# Patient Record
Sex: Female | Born: 1955 | Race: White | Hispanic: No | Marital: Single | Smoking: Never smoker
Health system: Southern US, Community
[De-identification: ages and names within clinical notes are randomized; demographics above are authoritative.]

## PROBLEM LIST (undated history)

## (undated) DIAGNOSIS — I493 Ventricular premature depolarization: Secondary | ICD-10-CM

## (undated) DIAGNOSIS — I2510 Atherosclerotic heart disease of native coronary artery without angina pectoris: Secondary | ICD-10-CM

## (undated) DIAGNOSIS — I471 Supraventricular tachycardia: Secondary | ICD-10-CM

## (undated) DIAGNOSIS — E7800 Pure hypercholesterolemia, unspecified: Secondary | ICD-10-CM

## (undated) DIAGNOSIS — I10 Essential (primary) hypertension: Secondary | ICD-10-CM

## (undated) DIAGNOSIS — R42 Dizziness and giddiness: Secondary | ICD-10-CM

## (undated) DIAGNOSIS — K635 Polyp of colon: Secondary | ICD-10-CM

## (undated) DIAGNOSIS — R911 Solitary pulmonary nodule: Secondary | ICD-10-CM

## (undated) DIAGNOSIS — I491 Atrial premature depolarization: Secondary | ICD-10-CM

## (undated) DIAGNOSIS — IMO0002 Reserved for concepts with insufficient information to code with codable children: Secondary | ICD-10-CM

## (undated) DIAGNOSIS — I4719 Other supraventricular tachycardia: Secondary | ICD-10-CM

## (undated) DIAGNOSIS — R001 Bradycardia, unspecified: Secondary | ICD-10-CM

## (undated) HISTORY — DX: Atherosclerotic heart disease of native coronary artery without angina pectoris: I25.10

## (undated) HISTORY — DX: Polyp of colon: K63.5

## (undated) HISTORY — DX: Reserved for concepts with insufficient information to code with codable children: IMO0002

## (undated) HISTORY — DX: Supraventricular tachycardia: I47.1

## (undated) HISTORY — DX: Dizziness and giddiness: R42

## (undated) HISTORY — PX: TONSILLECTOMY: SUR1361

## (undated) HISTORY — DX: Other supraventricular tachycardia: I47.19

## (undated) HISTORY — DX: Ventricular premature depolarization: I49.3

## (undated) HISTORY — DX: Bradycardia, unspecified: R00.1

## (undated) HISTORY — DX: Pure hypercholesterolemia, unspecified: E78.00

## (undated) HISTORY — DX: Atrial premature depolarization: I49.1

## (undated) HISTORY — DX: Solitary pulmonary nodule: R91.1

---

## 1998-04-12 ENCOUNTER — Inpatient Hospital Stay (HOSPITAL_COMMUNITY): Admission: EM | Admit: 1998-04-12 | Discharge: 1998-04-14 | Payer: Self-pay | Admitting: Emergency Medicine

## 2000-08-06 ENCOUNTER — Other Ambulatory Visit: Admission: RE | Admit: 2000-08-06 | Discharge: 2000-08-06 | Payer: Self-pay | Admitting: Obstetrics and Gynecology

## 2001-10-21 ENCOUNTER — Other Ambulatory Visit: Admission: RE | Admit: 2001-10-21 | Discharge: 2001-10-21 | Payer: Self-pay | Admitting: Obstetrics and Gynecology

## 2002-09-23 ENCOUNTER — Other Ambulatory Visit: Admission: RE | Admit: 2002-09-23 | Discharge: 2002-09-23 | Payer: Self-pay | Admitting: Obstetrics and Gynecology

## 2003-10-12 ENCOUNTER — Other Ambulatory Visit: Admission: RE | Admit: 2003-10-12 | Discharge: 2003-10-12 | Payer: Self-pay | Admitting: Obstetrics and Gynecology

## 2005-02-21 ENCOUNTER — Other Ambulatory Visit: Admission: RE | Admit: 2005-02-21 | Discharge: 2005-02-21 | Payer: Self-pay | Admitting: *Deleted

## 2005-08-20 ENCOUNTER — Encounter: Admission: RE | Admit: 2005-08-20 | Discharge: 2005-08-20 | Payer: Self-pay | Admitting: Family Medicine

## 2006-12-11 ENCOUNTER — Encounter: Admission: RE | Admit: 2006-12-11 | Discharge: 2006-12-11 | Payer: Self-pay | Admitting: Family Medicine

## 2007-03-25 ENCOUNTER — Other Ambulatory Visit: Admission: RE | Admit: 2007-03-25 | Discharge: 2007-03-25 | Payer: Self-pay | Admitting: Family Medicine

## 2008-06-22 ENCOUNTER — Other Ambulatory Visit: Admission: RE | Admit: 2008-06-22 | Discharge: 2008-06-22 | Payer: Self-pay | Admitting: Family Medicine

## 2009-06-25 ENCOUNTER — Other Ambulatory Visit: Admission: RE | Admit: 2009-06-25 | Discharge: 2009-06-25 | Payer: Self-pay | Admitting: Family Medicine

## 2012-03-23 ENCOUNTER — Encounter (HOSPITAL_COMMUNITY): Payer: Self-pay | Admitting: *Deleted

## 2012-03-23 ENCOUNTER — Emergency Department (INDEPENDENT_AMBULATORY_CARE_PROVIDER_SITE_OTHER): Payer: Self-pay

## 2012-03-23 ENCOUNTER — Emergency Department (INDEPENDENT_AMBULATORY_CARE_PROVIDER_SITE_OTHER)
Admission: EM | Admit: 2012-03-23 | Discharge: 2012-03-23 | Disposition: A | Discharge status: Home / Self Care | Payer: Self-pay | Source: Home / Self Care | Attending: Emergency Medicine | Admitting: Emergency Medicine

## 2012-03-23 DIAGNOSIS — S8010XA Contusion of unspecified lower leg, initial encounter: Secondary | ICD-10-CM

## 2012-03-23 DIAGNOSIS — S7000XA Contusion of unspecified hip, initial encounter: Secondary | ICD-10-CM

## 2012-03-23 DIAGNOSIS — S7002XA Contusion of left hip, initial encounter: Secondary | ICD-10-CM

## 2012-03-23 DIAGNOSIS — S8012XA Contusion of left lower leg, initial encounter: Secondary | ICD-10-CM

## 2012-03-23 HISTORY — DX: Essential (primary) hypertension: I10

## 2012-03-23 MED ORDER — TRAMADOL HCL 50 MG PO TABS: ORAL_TABLET | ORAL | Status: AC

## 2012-03-23 MED ORDER — HYDROCODONE-ACETAMINOPHEN 5-325 MG PO TABS: 2.0000 | ORAL_TABLET | Freq: Once | ORAL | Status: DC

## 2012-03-23 MED ORDER — HYDROCODONE-ACETAMINOPHEN 5-325 MG PO TABS
ORAL_TABLET | ORAL | Status: AC
  Filled 2012-03-23: qty 2

## 2012-03-23 MED ORDER — MELOXICAM 15 MG PO TABS: 15.0000 mg | ORAL_TABLET | Freq: Every day | ORAL | Status: AC

## 2012-03-23 NOTE — Discharge Instructions (Signed)
Cryotherapy Cryotherapy means treatment with cold. Ice or gel packs can be used to reduce both pain and swelling. Ice is the most helpful within the first 24 to 48 hours after an injury or flareup from overusing a muscle or joint. Sprains, strains, spasms, burning pain, shooting pain, and aches can all be eased with ice. Ice can also be used when recovering from surgery. Ice is effective, has very few side effects, and is safe for most people to use. PRECAUTIONS  Ice is not a safe treatment option for people with:  Raynaud's phenomenon. This is a condition affecting small blood vessels in the extremities. Exposure to cold may cause your problems to return.   Cold hypersensitivity. There are many forms of cold hypersensitivity, including:   Cold urticaria. Red, itchy hives appear on the skin when the tissues begin to warm after being iced.   Cold erythema. This is a red, itchy rash caused by exposure to cold.   Cold hemoglobinuria. Red blood cells break down when the tissues begin to warm after being iced. The hemoglobin that carry oxygen are passed into the urine because they cannot combine with blood proteins fast enough.   Numbness or altered sensitivity in the area being iced.  If you have any of the following conditions, do not use ice until you have discussed cryotherapy with your caregiver:  Heart conditions, such as arrhythmia, angina, or chronic heart disease.   High blood pressure.   Healing wounds or open skin in the area being iced.   Current infections.   Rheumatoid arthritis.   Poor circulation.   Diabetes.  Ice slows the blood flow in the region it is applied. This is beneficial when trying to stop inflamed tissues from spreading irritating chemicals to surrounding tissues. However, if you expose your skin to cold temperatures for too long or without the proper protection, you can damage your skin or nerves. Watch for signs of skin damage due to cold. HOME CARE  INSTRUCTIONS Follow these tips to use ice and cold packs safely.  Place a dry or damp towel between the ice and skin. A damp towel will cool the skin more quickly, so you may need to shorten the time that the ice is used.   For a more rapid response, add gentle compression to the ice.   Ice for no more than 10 to 20 minutes at a time. The bonier the area you are icing, the less time it will take to get the benefits of ice.   Check your skin after 5 minutes to make sure there are no signs of a poor response to cold or skin damage.   Rest 20 minutes or more in between uses.   Once your skin is numb, you can end your treatment. You can test numbness by very lightly touching your skin. The touch should be so light that you do not see the skin dimple from the pressure of your fingertip. When using ice, most people will feel these normal sensations in this order: cold, burning, aching, and numbness.   Do not use ice on someone who cannot communicate their responses to pain, such as small children or people with dementia.  HOW TO MAKE AN ICE PACK Ice packs are the most common way to use ice therapy. Other methods include ice massage, ice baths, and cryo-sprays. Muscle creams that cause a cold, tingly feeling do not offer the same benefits that ice offers and should not be used as a substitute  unless recommended by your caregiver. To make an ice pack, do one of the following:  Place crushed ice or a bag of frozen vegetables in a sealable plastic bag. Squeeze out the excess air. Place this bag inside another plastic bag. Slide the bag into a pillowcase or place a damp towel between your skin and the bag.   Mix 3 parts water with 1 part rubbing alcohol. Freeze the mixture in a sealable plastic bag. When you remove the mixture from the freezer, it will be slushy. Squeeze out the excess air. Place this bag inside another plastic bag. Slide the bag into a pillowcase or place a damp towel between your skin  and the bag.  SEEK MEDICAL CARE IF:  You develop white spots on your skin. This may give the skin a blotchy (mottled) appearance.   Your skin turns blue or pale.   Your skin becomes waxy or hard.   Your swelling gets worse.  MAKE SURE YOU:   Understand these instructions.   Will watch your condition.   Will get help right away if you are not doing well or get worse.  Document Released: 06/19/2011 Document Revised: 10/12/2011 Document Reviewed: 06/19/2011 [facility] Patient Information 2012 [place], [place].Contusion A contusion is a deep bruise. Contusions are the result of an injury that caused bleeding under the skin. The contusion may turn blue, purple, or yellow. Minor injuries will give you a painless contusion, but more severe contusions may stay painful and swollen for a few weeks.  CAUSES  A contusion is usually caused by a blow, trauma, or direct force to an area of the body. SYMPTOMS   Swelling and redness of the injured area.   Bruising of the injured area.   Tenderness and soreness of the injured area.   Pain.  DIAGNOSIS  The diagnosis can be made by taking a history and physical exam. An X-ray, CT scan, or MRI may be needed to determine if there were any associated injuries, such as fractures. TREATMENT  Specific treatment will depend on what area of the body was injured. In general, the best treatment for a contusion is resting, icing, elevating, and applying cold compresses to the injured area. Over-the-counter medicines may also be recommended for pain control. Ask your caregiver what the best treatment is for your contusion. HOME CARE INSTRUCTIONS   Put ice on the injured area.   Put ice in a plastic bag.   Place a towel between your skin and the bag.   Leave the ice on for 15 to 20 minutes, 3 to 4 times a day.   Only take over-the-counter or prescription medicines for pain, discomfort, or fever as directed by your caregiver. Your caregiver may recommend  avoiding anti-inflammatory medicines (aspirin, ibuprofen, and naproxen) for 48 hours because these medicines may increase bruising.   Rest the injured area.   If possible, elevate the injured area to reduce swelling.  SEEK IMMEDIATE MEDICAL CARE IF:   You have increased bruising or swelling.   You have pain that is getting worse.   Your swelling or pain is not relieved with medicines.  MAKE SURE YOU:   Understand these instructions.   Will watch your condition.   Will get help right away if you are not doing well or get worse.  Document Released: 08/02/2005 Document Revised: 10/12/2011 Document Reviewed: 08/28/2011 [facility] Patient Information 2012 [place], [place].

## 2012-03-23 NOTE — ED Provider Notes (Signed)
History     CSN: [id number]  Arrival date & time 03/23/12  1719   First MD Initiated Contact with Patient 03/23/12 1744      Chief Complaint  Patient presents with  . Leg Injury  . Leg Swelling  . Leg Pain    (Consider location/radiation/quality/duration/timing/severity/associated sxs/prior treatment) HPI Comments: Patient states she was struck by a metal discus on her lower lateral left thigh, and lower lateral leg at the fibula at a field and track event several hours prior to arrival. Now  reports pain, swelling, bruising. Reports pain with ambulation, but is able to walk.  Patient is a 56 y.o. female presenting with leg pain. The history is provided by the patient.  Leg Pain  The incident occurred at work. The injury mechanism was a direct blow. The pain is present in the left thigh and left leg. The quality of the pain is described as aching. The pain has been constant since onset. Pertinent negatives include no numbness, no inability to bear weight, no loss of motion, no muscle weakness and no tingling. She reports no foreign bodies present. The symptoms are aggravated by bearing weight, activity and palpation. She has tried ice and NSAIDs for the symptoms. The treatment provided moderate relief.    Past Medical History  Diagnosis Date  . High cholesterol   . Hypertension   . Irregular heart beat     History reviewed. No pertinent past surgical history.  History reviewed. No pertinent family history.  History  Substance Use Topics  . Smoking status: Never Smoker   . Smokeless tobacco: Not on file  . Alcohol Use: Yes    OB History    Grav Para Term Preterm Abortions TAB SAB Ect Mult Living                  Review of Systems  Neurological: Negative for tingling and numbness.    Allergies  Penicillins; Sulfa antibiotics; and Tetracyclines & related  Home Medications   Current Outpatient Rx  Name Route Sig Dispense Refill  . BYSTOLIC PO Oral Take 1.25 mg  by mouth 1 day or 1 dose.    [name] PRAVASTATIN SODIUM 40 MG PO TABS Oral Take 40 mg by mouth daily.    . MELOXICAM 15 MG PO TABS Oral Take 1 tablet (15 mg total) by mouth daily. 14 tablet 0  . TRAMADOL HCL 50 MG PO TABS  1-2 tabs po q 6 hr prn pain Maximum dose= 8 tablets per day 20 tablet 0    BP 146/74  Pulse 68  Temp(Src) 99 F (37.2 C) (Oral)  Resp 14  SpO2 100%  Physical Exam  Nursing note and vitals reviewed. Constitutional: She is oriented to person, place, and time. She appears well-developed and well-nourished. No distress.  HENT:  Head: Normocephalic and atraumatic.  Eyes: Conjunctivae and EOM are normal.  Neck: Normal range of motion.  Cardiovascular: Normal rate.   Pulmonary/Chest: Effort normal.  Abdominal: She exhibits no distension.  Musculoskeletal: Normal range of motion.       Legs:      14 x 7 cm hematoma lateral left thigh, 10.5 cm hematoma below knee fibula.   Neurological: She is alert and oriented to person, place, and time.  Skin: Skin is warm and dry.  Psychiatric: She has a normal mood and affect. Her behavior is normal. Judgment and thought content normal.    ED Course  Procedures (including critical care time)  Labs Reviewed -  No data to display Dg Femur Left  03/23/2012  *RADIOLOGY REPORT*  Clinical Data: Injury with pain.  LEFT FEMUR - 2 VIEW  Comparison: No comparison studies available.  Findings: Two-view exam of the left femur shows no fracture.  No worrisome lytic or sclerotic osseous abnormality.  IMPRESSION: No acute bony findings.  Original Report Authenticated By: ERIC A. MANSELL, M.D.   Dg Tibia/fibula Left  03/23/2012  *RADIOLOGY REPORT*  Clinical Data: Injury with soft tissue swelling and bruising in the region of the mid leg.  LEFT TIBIA AND FIBULA - 2 VIEW  Comparison: None.  Findings: No fracture.  No worrisome lytic or sclerotic osseous abnormality in the tibia or fibula.  IMPRESSION: No acute bony findings.  Original Report  Authenticated By: ERIC A. MANSELL, M.D.     1. Contusion, thigh and hip, left, initial encounter   2. Contusion of lower leg, left       MDM  X-ray reviewed by myself. Report per radiologist. Patient declined pain medication here. Applied Coban, pressure dressing. Home with NSAIDs, tramadol, ice, rest. F/u PRN  Luiz Blare, MD 03/24/12 1513

## 2012-03-23 NOTE — ED Notes (Signed)
Pt at track meet throwing cylinders shaped like a frisbee - pt taking photos hit left leg with cylinder large bruising and abrasion left lateral thigh just above knee and smaller bruised area left lower leg - pt has been applying ice - minimal walking onset of injury 1045am today

## 2013-06-12 ENCOUNTER — Other Ambulatory Visit: Payer: Self-pay | Admitting: Family Medicine

## 2013-06-12 ENCOUNTER — Other Ambulatory Visit (HOSPITAL_COMMUNITY)
Admission: RE | Admit: 2013-06-12 | Discharge: 2013-06-12 | Disposition: A | Discharge status: Home / Self Care | Payer: BC Managed Care – PPO | Source: Ambulatory Visit | Attending: Family Medicine | Admitting: Family Medicine

## 2013-06-12 DIAGNOSIS — Z01419 Encounter for gynecological examination (general) (routine) without abnormal findings: Secondary | ICD-10-CM | POA: Insufficient documentation

## 2013-08-25 ENCOUNTER — Encounter: Payer: Self-pay | Admitting: Cardiology

## 2013-08-25 ENCOUNTER — Encounter: Payer: Self-pay | Admitting: *Deleted

## 2013-08-26 ENCOUNTER — Encounter: Payer: Self-pay | Admitting: Cardiology

## 2013-08-26 DIAGNOSIS — I10 Essential (primary) hypertension: Secondary | ICD-10-CM | POA: Insufficient documentation

## 2013-08-26 DIAGNOSIS — I491 Atrial premature depolarization: Secondary | ICD-10-CM | POA: Insufficient documentation

## 2013-08-26 DIAGNOSIS — E785 Hyperlipidemia, unspecified: Secondary | ICD-10-CM | POA: Insufficient documentation

## 2013-08-26 DIAGNOSIS — I493 Ventricular premature depolarization: Secondary | ICD-10-CM | POA: Insufficient documentation

## 2013-08-27 ENCOUNTER — Ambulatory Visit (INDEPENDENT_AMBULATORY_CARE_PROVIDER_SITE_OTHER): Payer: BC Managed Care – PPO | Admitting: Cardiology

## 2013-08-27 ENCOUNTER — Encounter: Payer: Self-pay | Admitting: Cardiology

## 2013-08-27 VITALS — BP 122/84 | HR 64 | Ht 67.5 in | Wt 194.1 lb

## 2013-08-27 DIAGNOSIS — I491 Atrial premature depolarization: Secondary | ICD-10-CM

## 2013-08-27 DIAGNOSIS — I493 Ventricular premature depolarization: Secondary | ICD-10-CM

## 2013-08-27 DIAGNOSIS — I4949 Other premature depolarization: Secondary | ICD-10-CM

## 2013-08-27 NOTE — Progress Notes (Signed)
  [address] [place], [place]  [address] Phone: [phone] Fax:  [phone]  Date:  08/27/2013   ID:  Maria Cochran, DOB November 05, 1956, MRN [id number]  PCP:  Thora Lance, MD  Cardiologist:  Armanda Magic, MD     History of Present Illness: Maria Cochran is a 57 y.o. female with a history of PVC's.  She is doing well.  She denies any chest pain, SOB, DOE, LE edema, dizziness, palpitations or syncope.  When I saw her last she was having more palpitations and I increased her Bystolic to 2.5mg  1 tablet daily and that has pretty much resolved her palpitations.  She thinks her energy level has improved.     Wt Readings from Last 3 Encounters:  08/27/13 194 lb 1.9 oz (88.052 kg)     Past Medical History  Diagnosis Date  . Irregular heart beat   . Colonic polyp   . DDD (degenerative disc disease)   . PVC's (premature ventricular contractions)   . PAC (premature atrial contraction)   . Hypertension   . Hypercholesteremia   . Vertigo     Current Outpatient Prescriptions  Medication Sig Dispense Refill  . loratadine (CLARITIN) 10 MG tablet Take 10 mg by mouth as needed for allergies.      . Nebivolol HCl (BYSTOLIC PO) Take 2.5 mg by mouth 1 day or 1 dose.       . pravastatin (PRAVACHOL) 40 MG tablet Take 40 mg by mouth daily.      . vitamin C (ASCORBIC ACID) 500 MG tablet Take 500 mg by mouth daily.       No current facility-administered medications for this visit.    Allergies:    Allergies  Allergen Reactions  . Allegra [Fexofenadine Hcl]     [employer]   . Metoprolol     Severe fatigue   . Penicillins Swelling  . Sudafed [Pseudoephedrine Hcl]   . Sulfa Antibiotics Itching  . Tetracyclines & Related Swelling    Social History:  The patient  reports that she has never smoked. She does not have any smokeless tobacco history on file. She reports that she drinks alcohol. She reports that she does not use illicit drugs.   Family History:  [name] family  history includes CAD in her father; Heart disease in her father; Hypertension in her father; Parkinsonism in her mother.   ROS:  Please see the history of present illness.      All other systems reviewed and negative.   PHYSICAL EXAM: VS:  BP 122/84  Pulse 64  Ht 5' 7.5" (1.715 m)  Wt 194 lb 1.9 oz (88.052 kg)  BMI 29.94 kg/m2 Well nourished, well developed, in no acute distress HEENT: normal Neck: no JVD Cardiac:  normal S1, S2; RRR; no murmur Lungs:  clear to auscultation bilaterally, no wheezing, rhonchi or rales Abd: soft, nontender, no hepatomegaly Ext: no edema Skin: warm and dry Neuro:  CNs 2-12 intact, no focal abnormalities noted       ASSESSMENT AND PLAN:  1. PVC's controlled on beta blockers  - continue Bystolic    Followup with me in 6 months  Signed, Armanda Magic, MD 08/27/2013 11:43 AM

## 2013-08-27 NOTE — Patient Instructions (Signed)
Your physician recommends that you continue on your current medications as directed. Please refer to the Current Medication list given to you today.  Your physician wants you to follow-up in: 1 year with Dr. Turner. You will receive a reminder letter in the mail two months in advance. If you don't receive a letter, please call our office to schedule the follow-up appointment.  

## 2014-04-09 ENCOUNTER — Ambulatory Visit (INDEPENDENT_AMBULATORY_CARE_PROVIDER_SITE_OTHER): Payer: BC Managed Care – PPO | Admitting: Cardiology

## 2014-04-09 ENCOUNTER — Encounter: Payer: Self-pay | Admitting: Cardiology

## 2014-04-09 VITALS — BP 140/77 | HR 73 | Ht 67.5 in | Wt 188.8 lb

## 2014-04-09 DIAGNOSIS — I491 Atrial premature depolarization: Secondary | ICD-10-CM

## 2014-04-09 DIAGNOSIS — R002 Palpitations: Secondary | ICD-10-CM

## 2014-04-09 DIAGNOSIS — I493 Ventricular premature depolarization: Secondary | ICD-10-CM

## 2014-04-09 DIAGNOSIS — I4949 Other premature depolarization: Secondary | ICD-10-CM

## 2014-04-09 DIAGNOSIS — I10 Essential (primary) hypertension: Secondary | ICD-10-CM

## 2014-04-09 NOTE — Progress Notes (Addendum)
[address] [place], [place]  [address] Phone: [phone] Fax:  [phone]  Date:  04/09/2014   ID:  Maria Cochran, DOB March 03, 1956, MRN [id number]  PCP:  Thora Lance, MD  Cardiologist:  Armanda Magic, MD     History of Present Illness: Maria Cochran is a 58 y.o. female with a history of PVC's. She is doing well. She denies any chest pain, SOB, DOE, LE edema, dizziness,  or syncope. When I saw her last she was having more palpitations and I increased her Bystolic to 2.5mg  1 tablet daily and that has pretty much resolved her palpitations. She recently had a cold and the palpitations seemed to worsen.  She says her cold symptoms are better but the Palpitations have continued.    Wt Readings from Last 3 Encounters:  04/09/14 188 lb 12.8 oz (85.639 kg)  08/27/13 194 lb 1.9 oz (88.052 kg)     Past Medical History  Diagnosis Date  . Irregular heart beat   . Colonic polyp   . DDD (degenerative disc disease)   . PVC's (premature ventricular contractions)   . PAC (premature atrial contraction)   . Hypertension   . Hypercholesteremia   . Vertigo     Current Outpatient Prescriptions  Medication Sig Dispense Refill  . loratadine (CLARITIN) 10 MG tablet Take 10 mg by mouth as needed for allergies.      . Nebivolol HCl (BYSTOLIC PO) Take 2.5 mg by mouth 1 day or 1 dose.       . pravastatin (PRAVACHOL) 40 MG tablet Take 40 mg by mouth daily.       No current facility-administered medications for this visit.    Allergies:    Allergies  Allergen Reactions  . Allegra [Fexofenadine Hcl]     [employer]   . Metoprolol     Severe fatigue   . Penicillins Swelling  . Sudafed [Pseudoephedrine Hcl]   . Sulfa Antibiotics Itching  . Tetracyclines & Related Swelling    Social History:  The patient  reports that she has never smoked. She does not have any smokeless tobacco history on file. She reports that she drinks alcohol. She reports that she does not use illicit drugs.    Family History:  [name] family history includes CAD in her father; Heart disease in her father; Hypertension in her father; Parkinsonism in her mother.   ROS:  Please see the history of present illness.      All other systems reviewed and negative.   PHYSICAL EXAM: VS:  BP 140/77  Pulse 73  Ht 5' 7.5" (1.715 m)  Wt 188 lb 12.8 oz (85.639 kg)  BMI 29.12 kg/m2 Well nourished, well developed, in no acute distress HEENT: normal Neck: no JVD Cardiac:  normal S1, S2; RRR; no murmur Lungs:  clear to auscultation bilaterally, no wheezing, rhonchi or rales Abd: soft, nontender, no hepatomegaly Ext: no edema Skin: warm and dry Neuro:  CNs 2-12 intact, no focal abnormalities noted  EKG:  NSR with nonspecific ST wave abnormality    ASSESSMENT AND PLAN:  1.  PVC's controlled on beta blockers - continue Bystolic  - check BMET to make sure electrolytes are ok 2.  Palpitations - the palpitations that she has are now are slightly different in that it is more of a fluttering in her heart that lasts longer than before - event monitor to make sure these are just PVC's and not PAF 3.  HTN well controlled -  continue Bystolic  Followup with me PRN  Followup with me in 6 months     Signed, Armanda Magicraci Turner, MD 04/09/2014 1:38 PM

## 2014-04-09 NOTE — Patient Instructions (Signed)
Your physician has recommended that you wear an event monitor. Event monitors are medical devices that record the heart's electrical activity. Doctors most often us these monitors to diagnose arrhythmias. Arrhythmias are problems with the speed or rhythm of the heartbeat. The monitor is a small, portable device. You can wear one while you do your normal daily activities. This is usually used to diagnose what is causing palpitations/syncope (passing out).  Your physician recommends that you schedule a follow-up appointment as needed.  

## 2014-04-10 ENCOUNTER — Encounter: Payer: Self-pay | Admitting: Radiology

## 2014-04-10 ENCOUNTER — Other Ambulatory Visit: Payer: Self-pay | Admitting: *Deleted

## 2014-04-10 ENCOUNTER — Other Ambulatory Visit (INDEPENDENT_AMBULATORY_CARE_PROVIDER_SITE_OTHER): Payer: BC Managed Care – PPO

## 2014-04-10 ENCOUNTER — Encounter (INDEPENDENT_AMBULATORY_CARE_PROVIDER_SITE_OTHER): Payer: BC Managed Care – PPO

## 2014-04-10 DIAGNOSIS — I493 Ventricular premature depolarization: Secondary | ICD-10-CM

## 2014-04-10 DIAGNOSIS — Z79899 Other long term (current) drug therapy: Secondary | ICD-10-CM

## 2014-04-10 DIAGNOSIS — R002 Palpitations: Secondary | ICD-10-CM

## 2014-04-10 LAB — BASIC METABOLIC PANEL
BUN: 12 mg/dL (ref 6–23)
CHLORIDE: 103 meq/L (ref 96–112)
CO2: 31 meq/L (ref 19–32)
Calcium: 9.5 mg/dL (ref 8.4–10.5)
Creatinine, Ser: 1.1 mg/dL (ref 0.4–1.2)
GFR: 56.73 mL/min — ABNORMAL LOW (ref >60.00)
Glucose, Bld: 82 mg/dL (ref 70–99)
Potassium: 4.1 mEq/L (ref 3.5–5.1)
Sodium: 139 mEq/L (ref 135–145)

## 2014-04-10 NOTE — Progress Notes (Signed)
Patient ID: Maria Cochran, female   DOB: 07-04-1956, 58 y.o.   MRN: [id number] [employer] 30 day monitor applied

## 2014-05-25 ENCOUNTER — Telehealth: Payer: Self-pay | Admitting: Cardiology

## 2014-05-25 NOTE — Telephone Encounter (Signed)
Dr Mayford Knifeturner has not reviewed Report yet. Will call once Dr Mayford Knifeurner has reviewed.

## 2014-05-25 NOTE — Telephone Encounter (Signed)
New message ° ° ° °Pt is calling for monitor results. °

## 2014-05-26 ENCOUNTER — Telehealth: Payer: Self-pay | Admitting: Cardiology

## 2014-05-26 NOTE — Telephone Encounter (Signed)
lmtrc

## 2014-05-26 NOTE — Telephone Encounter (Signed)
Copied over to other telephone encounter

## 2014-05-26 NOTE — Telephone Encounter (Signed)
Please let patient know that heart monitor showed NSR with HR 46bpm (during sleep) to 11bpm with 6 PAC's in a row which are benign

## 2014-05-26 NOTE — Telephone Encounter (Signed)
[name], MD at 05/26/2014 7:51 AM     Status: Signed        Please let patient know that heart monitor showed NSR with HR 46bpm (during sleep) to 11bpm with 6 PAC's in a row which are benign

## 2014-05-28 NOTE — Telephone Encounter (Signed)
Pt is aware.  

## 2014-05-28 NOTE — Telephone Encounter (Signed)
lmtrc

## 2014-05-28 NOTE — Telephone Encounter (Signed)
Follow up ° ° ° ° °Returned Maria Cochran's call °

## 2014-06-03 ENCOUNTER — Telehealth: Payer: Self-pay | Admitting: Cardiology

## 2014-06-03 NOTE — Telephone Encounter (Signed)
New Message  Pt called reports irregular heart beats since 05/29/2014. Pt would like to speak with a nurse. Please call back to discuss.

## 2014-06-03 NOTE — Telephone Encounter (Signed)
Do you want pt to come in for EKG?

## 2014-06-03 NOTE — Telephone Encounter (Signed)
Pt set up for Thursday at 11:00 for EKG

## 2014-06-03 NOTE — Telephone Encounter (Signed)
yes

## 2014-06-03 NOTE — Telephone Encounter (Signed)
Pt agreed and will be at Nurse visit.

## 2014-06-04 ENCOUNTER — Encounter: Payer: Self-pay | Admitting: General Surgery

## 2014-06-04 ENCOUNTER — Ambulatory Visit (INDEPENDENT_AMBULATORY_CARE_PROVIDER_SITE_OTHER): Payer: BC Managed Care – PPO | Admitting: *Deleted

## 2014-06-04 VITALS — BP 130/78 | HR 58 | Wt 186.0 lb

## 2014-06-04 DIAGNOSIS — R002 Palpitations: Secondary | ICD-10-CM

## 2014-06-04 LAB — MAGNESIUM: Magnesium: 2 mg/dL (ref 1.5–2.5)

## 2014-06-04 LAB — BASIC METABOLIC PANEL
BUN: 11 mg/dL (ref 6–23)
CO2: 27 mEq/L (ref 19–32)
Calcium: 9.2 mg/dL (ref 8.4–10.5)
Chloride: 103 mEq/L (ref 96–112)
Creatinine, Ser: 0.7 mg/dL (ref 0.4–1.2)
GFR: 87.2 mL/min (ref >60.00)
Glucose, Bld: 85 mg/dL (ref 70–99)
Potassium: 4.1 mEq/L (ref 3.5–5.1)
Sodium: 138 mEq/L (ref 135–145)

## 2014-06-04 NOTE — Progress Notes (Signed)
Pt here for EKG. She reports palpitations Sunday and Monday. No shortness of breath with this. Able to do all activities. No increased caffeine. Was busy and did not eat at regular times. She called office earlier this week and appt made for EKG today. She reports palpitations have gotten better throughout the week. She is feeling well today and without palpitations. EKG done. Pt information and EKG reviewed by Dr. Mayford Knifeurner. Orders given to check BMP and Mg. Pt taken to lab.  Pt aware to continue same medications.

## 2014-09-04 ENCOUNTER — Ambulatory Visit (INDEPENDENT_AMBULATORY_CARE_PROVIDER_SITE_OTHER): Payer: BC Managed Care – PPO | Admitting: Cardiology

## 2014-09-04 ENCOUNTER — Encounter: Payer: Self-pay | Admitting: Cardiology

## 2014-09-04 VITALS — BP 122/76 | HR 66 | Ht 65.0 in | Wt 188.4 lb

## 2014-09-04 DIAGNOSIS — I493 Ventricular premature depolarization: Secondary | ICD-10-CM

## 2014-09-04 DIAGNOSIS — I10 Essential (primary) hypertension: Secondary | ICD-10-CM

## 2014-09-04 DIAGNOSIS — I491 Atrial premature depolarization: Secondary | ICD-10-CM

## 2014-09-04 NOTE — Patient Instructions (Signed)
Your physician wants you to follow-up in: 6 months with Dr. Turner. You will receive a reminder letter in the mail two months in advance. If you don't receive a letter, please call our office to schedule the follow-up appointment.  Your physician recommends that you continue on your current medications as directed. Please refer to the Current Medication list given to you today.  

## 2014-09-04 NOTE — Progress Notes (Signed)
[address] [place], [place]  [id number] Phone: [phone] Fax:  [phone]  Date:  09/04/2014   ID:  Maria FailLynda G Cosma, DOB June 30, 1956, MRN [id number]  PCP:  Thora LanceEHINGER,ROBERT R, MD  Cardiologist:  Armanda Magicraci Bryanne Riquelme, MD    History of Present Illness: Maria Cochran is a 58 y.o. female with a history of PVC's. She is doing well. She denies any chest pain, SOB, DOE, LE edema, dizziness, or syncope. When I saw her last she was having more palpitations and I increased her Bystolic to 2.5mg  1 tablet daily and that pretty much resolved her palpitations. But then she had a cold and the palpitations seemed to worsen.  At her last OV she said her cold symptoms resolved but the Palpitations continued.  An event monitor was obtained which showed NSR with nonsustained atrial tachycardia up to 6 beats. She now presents back today for followup.  She had a lot of palpitations over the summer but now they have settled down.  Now she says that they occur infrequently.  She denies any SOB, dizziness, LE edema or syncope.She has noticed some discomfort in the right axilla that is worse with certain movements she does such as driving and when she sleeps on her back.  She says that it is very vague soreness.    Wt Readings from Last 3 Encounters:  09/04/14 188 lb 6.4 oz (85.458 kg)  06/04/14 186 lb (84.369 kg)  04/09/14 188 lb 12.8 oz (85.639 kg)     Past Medical History  Diagnosis Date  . Irregular heart beat   . Colonic polyp   . DDD (degenerative disc disease)   . PVC's (premature ventricular contractions)   . PAC (premature atrial contraction)   . Hypertension   . Hypercholesteremia   . Vertigo   . Atrial tachycardia, paroxysmal     Current Outpatient Prescriptions  Medication Sig Dispense Refill  . mupirocin ointment (BACTROBAN) 2 % Apply 1 application topically as needed (tick bite).       [name]. loratadine (CLARITIN) 10 MG tablet Take 10 mg by mouth as needed for allergies.      . Nebivolol HCl  (BYSTOLIC PO) Take 2.5 mg by mouth 1 day or 1 dose.       . pravastatin (PRAVACHOL) 40 MG tablet Take 40 mg by mouth daily.       No current facility-administered medications for this visit.    Allergies:    Allergies  Allergen Reactions  . Allegra [Fexofenadine Hcl]     [employer]   . Metoprolol     Severe fatigue   . Penicillins Swelling  . Sudafed [Pseudoephedrine Hcl]   . Sulfa Antibiotics Itching  . Tetracyclines & Related Swelling    Social History:  The patient  reports that she has never smoked. She does not have any smokeless tobacco history on file. She reports that she drinks alcohol. She reports that she does not use illicit drugs.   Family History:  [name] family history includes CAD in her father; Heart disease in her father; Hypertension in her father; Parkinsonism in her mother.   ROS:  Please see the history of present illness.      All other systems reviewed and negative.   PHYSICAL EXAM: VS:  BP 122/76  Pulse 66  Ht 5\' 5"  (1.651 m)  Wt 188 lb 6.4 oz (85.458 kg)  BMI 31.35 kg/m2 Well nourished, well developed, in no acute distress HEENT: normal Neck: no  JVD Cardiac:  normal S1, S2; RRR; no murmur Lungs:  clear to auscultation bilaterally, no wheezing, rhonchi or rales Abd: soft, nontender, no hepatomegaly Ext: no edema Skin: warm and dry Neuro:  CNs 2-12 intact, no focal abnormalities noted  EKG:  NSR with no ST changes with nonspecific ST abnormality     ASSESSMENT AND PLAN:  1. PVC's controlled on beta blockers  - continue Bystolic  2. Palpitations with event monitor showing no PAF and only 1 short run of nonsustained atrial tach - these seem to have settled down 3. HTN well controlled  - continue Bystolic  4.  Atypical right axillary pain that is most consistent with a muscular origin  Followup with me in 6 months   Signed, Armanda Magicraci Elzia Hott, MD [facility] 09/04/2014 3:35 PM

## 2014-11-06 DIAGNOSIS — I2510 Atherosclerotic heart disease of native coronary artery without angina pectoris: Secondary | ICD-10-CM

## 2014-11-06 HISTORY — DX: Atherosclerotic heart disease of native coronary artery without angina pectoris: I25.10

## 2014-12-09 ENCOUNTER — Encounter: Payer: Self-pay | Admitting: Cardiology

## 2015-03-18 ENCOUNTER — Ambulatory Visit: Payer: Self-pay | Admitting: Cardiology

## 2015-04-06 DIAGNOSIS — I471 Supraventricular tachycardia: Secondary | ICD-10-CM | POA: Insufficient documentation

## 2015-04-06 NOTE — Progress Notes (Signed)
Cardiology Office Note   Date:  04/07/2015   ID:  Maria Cochran, DOB 02-07-1956, MRN [id number]  PCP:  Thora LanceEHINGER,ROBERT R, MD    Chief Complaint  Patient presents with  . Follow-up    essential hypertension      History of Present Illness: Maria Cochran is a 59 y.o. female with a history of PVC's and nonsustained atrial tachycardia. She is doing well. She denies any chest pain, SOB, DOE, LE edema, dizziness, or syncope.  She now presents back today for followup.  She rarely will notice a skipped a beat.    Past Medical History  Diagnosis Date  . Irregular heart beat   . Colonic polyp   . DDD (degenerative disc disease)   . PVC's (premature ventricular contractions)   . PAC (premature atrial contraction)   . Hypertension   . Hypercholesteremia   . Vertigo   . Atrial tachycardia, paroxysmal     Past Surgical History  Procedure Laterality Date  . Tonsillectomy       Current Outpatient Prescriptions  Medication Sig Dispense Refill  . loratadine (CLARITIN) 10 MG tablet Take 10 mg by mouth as needed for allergies.    . Nebivolol HCl (BYSTOLIC PO) Take 2.5 mg by mouth daily.     . pravastatin (PRAVACHOL) 40 MG tablet Take 40 mg by mouth daily.     No current facility-administered medications for this visit.    Allergies:   Allegra; Metoprolol; Penicillins; Sudafed; Sulfa antibiotics; and Tetracyclines & related    Social History:  The patient  reports that she has never smoked. She does not have any smokeless tobacco history on file. She reports that she drinks alcohol. She reports that she does not use illicit drugs.   Family History:  [name] family history includes CAD in her father; Heart disease in her father; Hypertension in her father; Parkinsonism in her mother.    ROS:  Please see the history of present illness.   Otherwise, review of systems are positive for none.   All other systems are reviewed and negative.    PHYSICAL EXAM: VS:   BP 114/68 mmHg  Pulse 72  Ht 5\' 5"  (1.651 m)  Wt 185 lb 12.8 oz (84.278 kg)  BMI 30.92 kg/m2  SpO2 98% , BMI Body mass index is 30.92 kg/(m^2). GEN: Well nourished, well developed, in no acute distress HEENT: normal Neck: no JVD, carotid bruits, or masses Cardiac: RRR; no murmurs, rubs, or gallops,no edema  Respiratory:  clear to auscultation bilaterally, normal work of breathing GI: soft, nontender, nondistended, + BS MS: no deformity or atrophy Skin: warm and dry, no rash Neuro:  Strength and sensation are intact Psych: euthymic mood, full affect   EKG:  EKG is not ordered today.    Recent Labs: 06/04/2014: BUN 11; Creatinine 0.7; Magnesium 2.0; Potassium 4.1; Sodium 138    Lipid Panel No results found for: CHOL, TRIG, HDL, CHOLHDL, VLDL, LDLCALC, LDLDIRECT    Wt Readings from Last 3 Encounters:  04/07/15 185 lb 12.8 oz (84.278 kg)  09/04/14 188 lb 6.4 oz (85.458 kg)  06/04/14 186 lb (84.369 kg)    ASSESSMENT AND PLAN:  1. PVC's controlled on beta blockers  - continue Bystolic  2. Nonsustained atrial tach controlled on BB 3. HTN well controlled  - continue Bystolic  4.  Family history of CAD in her father - I  will get an exercise treadmill test to rule out ischemia.       Current medicines are reviewed at length with the patient today.  The patient does not have concerns regarding medicines.  The following changes have been made:  no change  Labs/ tests ordered today: See above Assessment and Plan No orders of the defined types were placed in this encounter.     Disposition:   FU with me in 1 year  Signed, Quintella Reichert, MD  04/07/2015 8:44 AM    [facility] [address] [place], [place], [place]  [address] Phone: [phone]; Fax: [phone]

## 2015-04-07 ENCOUNTER — Encounter: Payer: Self-pay | Admitting: Cardiology

## 2015-04-07 ENCOUNTER — Ambulatory Visit (INDEPENDENT_AMBULATORY_CARE_PROVIDER_SITE_OTHER): Payer: BLUE CROSS/BLUE SHIELD | Admitting: Cardiology

## 2015-04-07 VITALS — BP 114/68 | HR 72 | Ht 65.0 in | Wt 185.8 lb

## 2015-04-07 DIAGNOSIS — I471 Supraventricular tachycardia: Secondary | ICD-10-CM

## 2015-04-07 DIAGNOSIS — Z8249 Family history of ischemic heart disease and other diseases of the circulatory system: Secondary | ICD-10-CM

## 2015-04-07 DIAGNOSIS — I493 Ventricular premature depolarization: Secondary | ICD-10-CM

## 2015-04-07 DIAGNOSIS — I10 Essential (primary) hypertension: Secondary | ICD-10-CM | POA: Diagnosis not present

## 2015-04-07 NOTE — Patient Instructions (Signed)
Medication Instructions:  Your physician recommends that you continue on your current medications as directed. Please refer to the Current Medication list given to you today.   Labwork: None  Testing/Procedures: Your physician has requested that you have an exercise tolerance test. For further information please visit [email or web address]. Please also follow instruction sheet, as given.  Follow-Up: Your physician wants you to follow-up in: 1 year with Dr. Turner. You will receive a reminder letter in the mail two months in advance. If you don't receive a letter, please call our office to schedule the follow-up appointment.   Any Other Special Instructions Will Be Listed Below (If Applicable). 

## 2015-05-04 ENCOUNTER — Telehealth: Payer: Self-pay | Admitting: Cardiology

## 2015-05-04 DIAGNOSIS — R002 Palpitations: Secondary | ICD-10-CM

## 2015-05-04 NOTE — Telephone Encounter (Signed)
New Message        Pt calling stating she has had an irregular hearty beat since yesterday and very high anxiety. Please call back and advise.

## 2015-05-04 NOTE — Telephone Encounter (Signed)
Patient st her HR has been irregular for 2 days.  Patient has not checked her BP, but patient checked HR on the phone and it was 66 bpm. Patient has no complaints other than irregular HR and anxiety.   Per Dr. Mayford Knifeurner, patient to wear event monitor for 30 days.  Patient agrees with treatment plan and monitor ordered for scheduling.

## 2015-05-12 ENCOUNTER — Ambulatory Visit (INDEPENDENT_AMBULATORY_CARE_PROVIDER_SITE_OTHER): Payer: BLUE CROSS/BLUE SHIELD

## 2015-05-12 DIAGNOSIS — R002 Palpitations: Secondary | ICD-10-CM | POA: Diagnosis not present

## 2015-06-01 ENCOUNTER — Encounter: Payer: Self-pay | Admitting: Physician Assistant

## 2015-06-08 ENCOUNTER — Encounter: Payer: BLUE CROSS/BLUE SHIELD | Admitting: Physician Assistant

## 2015-06-15 ENCOUNTER — Telehealth: Payer: Self-pay | Admitting: Cardiology

## 2015-06-15 NOTE — Telephone Encounter (Signed)
New Message  Pt wanted to speak w/ RN about Heart monitor results; pt has stress test on 8/30. Pt wants to know what to do going forward. Please call back and discuss

## 2015-06-15 NOTE — Telephone Encounter (Signed)
Informed patient that monitor results were just put in the system at 1100 this AM, and Dr. Mayford Knife has not sent results/instructions to me yet. Patient would like Dr. Mayford Knife to review results ASAP. She is experiencing fluttering and not knowing results is causing "major anxiety."   To Dr. Mayford Knife.

## 2015-06-16 MED ORDER — NEBIVOLOL HCL 5 MG PO TABS: 5.0000 mg | ORAL_TABLET | Freq: Every day | ORAL | Status: DC

## 2015-06-16 NOTE — Telephone Encounter (Signed)
-----   Message from Quintella Reichert, MD sent at 06/15/2015  8:45 PM EDT ----- Please let patient know that heart monitor showed NSR with PVC's and PAC's and nonsustained atrial tachycardia.  Please increase Bystolic to  daily and call in a few days to let us know how she is doing with her palpitations.

## 2015-06-16 NOTE — Telephone Encounter (Signed)
Informed patient of results and verbal understanding expressed.  Instructed patient to INCREASE BYSTOLIC to 5 mg daily. Patient understands to call next week and let a triage nurse know how her palpitations are doing. Patient agrees with treatment plan.

## 2015-07-06 ENCOUNTER — Ambulatory Visit (INDEPENDENT_AMBULATORY_CARE_PROVIDER_SITE_OTHER): Payer: BLUE CROSS/BLUE SHIELD

## 2015-07-06 ENCOUNTER — Encounter: Payer: BLUE CROSS/BLUE SHIELD | Admitting: Cardiology

## 2015-07-06 ENCOUNTER — Encounter: Payer: Self-pay | Admitting: Cardiology

## 2015-07-06 DIAGNOSIS — Z8249 Family history of ischemic heart disease and other diseases of the circulatory system: Secondary | ICD-10-CM

## 2015-07-06 LAB — EXERCISE TOLERANCE TEST
CHL CUP MPHR: 162 {beats}/min
CHL CUP RESTING HR STRESS: 65 {beats}/min
Estimated workload: 10.1 METS
Exercise duration (min): 9 min
Peak HR: 155 {beats}/min
Percent HR: 95 %
RPE: 17

## 2015-07-07 ENCOUNTER — Telehealth: Payer: Self-pay | Admitting: Cardiology

## 2015-07-07 DIAGNOSIS — R9439 Abnormal result of other cardiovascular function study: Secondary | ICD-10-CM

## 2015-07-07 NOTE — Telephone Encounter (Signed)
New message ° ° ° ° °Returning Katy's call °

## 2015-07-07 NOTE — Telephone Encounter (Signed)
Informed patient of results and verbal understanding expressed.  Exercise myoview ordered for scheduling and instructions reviewed. Patient agrees with treatment plan.

## 2015-07-07 NOTE — Telephone Encounter (Signed)
-----   Message from Quintella Reichert, MD sent at 07/06/2015  9:42 PM EDT ----- Abnormal ETT - please order a stress myoview to rule out ischemia

## 2015-07-08 ENCOUNTER — Telehealth: Payer: Self-pay | Admitting: Cardiology

## 2015-07-08 NOTE — Telephone Encounter (Signed)
Patient concerned about activity level before nuclear stress test. Instructed the patient to take it easy until her stress test Tuesday and to have a nice, relaxing weekend. Patient grateful for callback.

## 2015-07-08 NOTE — Telephone Encounter (Signed)
New Message  Pt requested to speak w/ Katy. Pt would not specify. Please call back and discuss.

## 2015-07-09 ENCOUNTER — Telehealth (HOSPITAL_COMMUNITY): Payer: Self-pay

## 2015-07-09 NOTE — Telephone Encounter (Signed)
Patient given detailed instructions per Myocardial Perfusion Study Information Sheet for test on 07-13-2015 at 1215. Patient notified to arrive 15 minutes early and that it is imperative to arrive on time for appointment to keep from having the test rescheduled.  If you need to cancel or reschedule your appointment, please call the office within 24 hours of your appointment. Failure to do so may result in a cancellation of your appointment, and a $50 no show fee. Patient verbalized understanding. Randa Evens, Shepherd Finnan A

## 2015-07-13 ENCOUNTER — Ambulatory Visit (HOSPITAL_COMMUNITY): Payer: BLUE CROSS/BLUE SHIELD | Attending: Cardiology

## 2015-07-13 DIAGNOSIS — E785 Hyperlipidemia, unspecified: Secondary | ICD-10-CM | POA: Diagnosis not present

## 2015-07-13 DIAGNOSIS — R0602 Shortness of breath: Secondary | ICD-10-CM | POA: Insufficient documentation

## 2015-07-13 DIAGNOSIS — R9439 Abnormal result of other cardiovascular function study: Secondary | ICD-10-CM | POA: Insufficient documentation

## 2015-07-13 DIAGNOSIS — R42 Dizziness and giddiness: Secondary | ICD-10-CM | POA: Insufficient documentation

## 2015-07-13 DIAGNOSIS — I10 Essential (primary) hypertension: Secondary | ICD-10-CM | POA: Diagnosis not present

## 2015-07-13 LAB — MYOCARDIAL PERFUSION IMAGING
Estimated workload: 10.1 METS
Exercise duration (min): 9 min
Exercise duration (sec): 0 s
LV dias vol: 58 mL
LV sys vol: 15 mL
MPHR: 162 {beats}/min
Peak HR: 157 {beats}/min
Percent HR: 96 %
RATE: 0.27
Rest HR: 56 {beats}/min
SDS: 2
SRS: 10
SSS: 12
TID: 1.01

## 2015-07-13 MED ORDER — TECHNETIUM TC 99M SESTAMIBI GENERIC - CARDIOLITE
10.6000 | Freq: Once | INTRAVENOUS | Status: AC | PRN
  Administered 2015-07-13: 11 via INTRAVENOUS

## 2015-07-13 MED ORDER — TECHNETIUM TC 99M SESTAMIBI GENERIC - CARDIOLITE
31.4000 | Freq: Once | INTRAVENOUS | Status: AC | PRN
  Administered 2015-07-13: 31 via INTRAVENOUS

## 2015-07-16 ENCOUNTER — Telehealth: Payer: Self-pay | Admitting: Cardiology

## 2015-07-16 DIAGNOSIS — R9439 Abnormal result of other cardiovascular function study: Secondary | ICD-10-CM

## 2015-07-16 NOTE — Telephone Encounter (Signed)
New problem ° ° °Pt want to know results of stress test. °

## 2015-07-16 NOTE — Telephone Encounter (Signed)
-----   Message from Quintella Reichert, MD sent at 07/14/2015  5:03 PM EDT ----- Abnormal stress test but no symptoms of chest pain.  Abnormal EKG could be false positive ETT.  Please order a coronary CTA with morphology to assess further

## 2015-07-16 NOTE — Telephone Encounter (Signed)
Informed patient of results and verbal understanding expressed.  Coronary CTA ordered for scheduling. Patient agrees with treatment plan. 

## 2015-07-22 ENCOUNTER — Telehealth: Payer: Self-pay | Admitting: Cardiology

## 2015-07-22 NOTE — Telephone Encounter (Signed)
Patient has prescription for Xanax from PCP and has taken PRN for years. Confirmed with patient that PCP has updated medication list. Informed patient she may take her medication as directed since she has high anxiety over recent testing. Instructed patient to call if she has further questions or concerns.

## 2015-07-22 NOTE — Telephone Encounter (Signed)
New message     Is it ok for pt to take xanax?

## 2015-07-30 ENCOUNTER — Encounter: Payer: Self-pay | Admitting: Cardiology

## 2015-08-03 ENCOUNTER — Telehealth: Payer: Self-pay | Admitting: Cardiology

## 2015-08-03 NOTE — Telephone Encounter (Signed)
Notified pt of answers to her questions. She may take her medications, the test will be approximately 45 minutes and she asked the location - 1st floor, [facility] - Radiology across from medical records department.

## 2015-08-03 NOTE — Telephone Encounter (Signed)
New message     Pt is having a CTA of the heart tomorrow.  She want to know if she can take her medications and how long will the test be?

## 2015-08-04 ENCOUNTER — Ambulatory Visit (HOSPITAL_COMMUNITY)
Admission: RE | Admit: 2015-08-04 | Discharge: 2015-08-04 | Disposition: A | Discharge status: Home / Self Care | Payer: BLUE CROSS/BLUE SHIELD | Source: Ambulatory Visit | Attending: Cardiology | Admitting: Cardiology

## 2015-08-04 DIAGNOSIS — R9439 Abnormal result of other cardiovascular function study: Secondary | ICD-10-CM | POA: Diagnosis present

## 2015-08-04 DIAGNOSIS — R931 Abnormal findings on diagnostic imaging of heart and coronary circulation: Secondary | ICD-10-CM | POA: Diagnosis not present

## 2015-08-04 DIAGNOSIS — R911 Solitary pulmonary nodule: Secondary | ICD-10-CM | POA: Insufficient documentation

## 2015-08-04 DIAGNOSIS — I2510 Atherosclerotic heart disease of native coronary artery without angina pectoris: Secondary | ICD-10-CM | POA: Insufficient documentation

## 2015-08-04 MED ORDER — NITROGLYCERIN 0.4 MG SL SUBL
SUBLINGUAL_TABLET | SUBLINGUAL | Status: AC
  Administered 2015-08-04: 0.8 mg
  Filled 2015-08-04: qty 2

## 2015-08-04 MED ORDER — IOHEXOL 350 MG/ML SOLN
80.0000 mL | Freq: Once | INTRAVENOUS | Status: AC | PRN
  Administered 2015-08-04: 100 mL via INTRAVENOUS

## 2015-08-10 ENCOUNTER — Telehealth: Payer: Self-pay

## 2015-08-10 DIAGNOSIS — E7800 Pure hypercholesterolemia, unspecified: Secondary | ICD-10-CM

## 2015-08-10 DIAGNOSIS — Z01812 Encounter for preprocedural laboratory examination: Secondary | ICD-10-CM

## 2015-08-10 DIAGNOSIS — R931 Abnormal findings on diagnostic imaging of heart and coronary circulation: Secondary | ICD-10-CM

## 2015-08-10 NOTE — Telephone Encounter (Signed)
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  Please set up for left heart cath.  I tried calling her but could not reach her.  Please bring in to see PA for cath workup.

## 2015-08-10 NOTE — Telephone Encounter (Signed)
Informed patient of results and verbal understanding expressed.  OV scheduled with K. Bryant, [place], on Thursday, October 6 to work-up for L heart cath. FLP, ALT and pre-cath labs scheduled for same day. Patient agrees with treatment plan.

## 2015-08-10 NOTE — Progress Notes (Signed)
Cardiology Office Note    Date:  08/10/2015   ID:  Maria Cochran, DOB December 09, 1955, MRN [id number]  PCP:  Thora Lance, MD  Cardiologist:  Dr. Mayford Knife     History of Present Illness: Maria Cochran is a 59 y.o. female with a history of HTN, PVCs and non sustained atrial tachycardia who presents for pre cath work up after a recent high risk coronary CTA and abnormal nuclear stress test.   She was last seen by Dr Mayford Knife on 04/07/15 for follow up of palpiations that were being controlled on Bystolic. Due to a family history of CAD in her father an exercise stress test was ordered. Subsequently, a telephone note reveals that she had further palpitations. A cardiac monitor was ordered which revewaled PVCs and Non sustained atrial tach. Her Bystolic was increased to  daily. She underwent POET on 07/06/15 which returned abnormal. She was subsequently set up for ETT Nuc which also returned abnormal ( see report below). Dr. Mayford Knife felt abnormal EKG could be false positive ETT and ordered a coronary CTA with morphology to assess further. 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   She was asked to come into the office today to discuss cardiac catheterization. Patient denies any chest discomfort or SOB. She denies exertional CP or DOE. 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 She occasionally has bilateral breast tightness that she thinks is related to menopause. Sometimes she has a pinpoint pressure sensation in her chest under her breast.  Her palpitations have been well controlled on increased bystolic. No Le edema, orthopnea, PND or dizziness. No hx of CVA or bleeding. No allergies.   Studies:  - Nuclear (07/13/15):    The left ventricular ejection fraction is hyperdynamic (>65%).  Nuclear stress EF: 75%.  Horizontal ST segment depression ST segment depression was noted during stress in the V4, V5,  V6, II, III and aVF leads, and returning to baseline after 1-5 minutes of recovery.  Defect 1: There is a medium defect of moderate severity present in the mid anteroseptal, apical anterior and apical septal location.  Findings consistent with prior myocardial infarction with peri-infarct ischemia.  This is an intermediate risk study with concerning ETT as well as possible LAD perfusion d  - Cardiac CT (08/05/15):  Impressions: 1. Coronary calcium score of 519. This was 86 percentile for age and sex matched control. 2. Normal coronary origin. Right dominance. 3. There is moderate diffuse plaque in RCA and LAD with high risk features in the mid LAD plaque. An aggressive risk factor modification is recommended.   Recent Labs/Images:  No results for input(s): NA, K, BUN, CREATININE, ALT, HGB, TSH, LDLCALC, LDLDIRECT, HDL, BNP, PROBNP in the last 8760 hours.  Invalid input(s): LDL   Ct Coronary Morp W/cta Cor W/score W/ca W/cm &/or Wo/cm  08/05/2015   ADDENDUM REPORT: 08/05/2015 16:37 CLINICAL DATA:  59 year old female with abnormal stress test and no chest pain. EXAM: Cardiac/Coronary  CT TECHNIQUE: The patient was scanned on a Philips 256 scanner. FINDINGS: A 120 kV prospective scan was triggered in the descending thoracic aorta at 111 HU's. Axial non-contrast 3 mm slices were carried out through the heart. The data set was analyzed on a dedicated work station and scored using the Agatson method. Gantry rotation speed was 270 msecs and collimation was .9 mm. No beta blockade and 0.8 mg of sl NTG  was given. The 3D data set was reconstructed in 5% intervals of the 67-82 % of the R-R cycle. Diastolic phases were analyzed on a dedicated work station using MPR, MIP and VRT modes. The patient received 80 cc of contrast. Aorta:  No calcifications.  Normal caliber.  No dissection. Aortic Valve:  Trileaflet.  No calcifications. Coronary Arteries:  Normal coronary origin.  Right dominance. Left main  artery has mild calcified plaque in its distal portion associated with 25-50% stenosis that is extending into proximal LAD. LAD is a large vessel that gives rise to two diagonal branches. There is a long moderate mixed, predominantly calcified plaque in mid LAD associated with 50-69% stenosis. This plaque has a high risk feature (napkin ring sign). There is no significant plaque in distal LAD. D1 has minimal ostial plaque. D2 has no obvious plaque. LCX artery is small non-dominant and gives rise to one obtuse marginal branch. There is minimal calcified plaque in the ostial LCX artery with associated stenosis 0-25%. RCA is a large dominant vessel that gives rise to PDA and PLA. There is a moderate diffuse mixed, predominantly calcified plaque in the proximal and mid RCA with maximum stenosis 50-69%. PDA has moderate calcified plaque in its distal portion associated with 50-69%. PLA is small with no obvious stenosis. Other findings: Normal pulmonary vein drainage. No ASD or VSD found. Normal size of the atrial appendage with no filling defect. Normal size of the pulmonary artery. IMPRESSION: 1. Coronary calcium score of 519. This was 23 percentile for age and sex matched control. 2. Normal coronary origin.  Right dominance. 3. There is moderate diffuse plaque in RCA and LAD with high risk features in the mid LAD plaque. An aggressive risk factor modification is recommended. Maria Cochran Electronically Signed   By: Maria Cochran   On: 08/05/2015 16:37  08/05/2015   EXAM: OVER-READ INTERPRETATION  CT CHEST  The following report is an over-read performed by radiologist Dr. Royal Piedra [facility], Maria Cochran on 08/04/2015. This over-read does not include interpretation of cardiac or coronary anatomy or pathology. The coronary calcium score/coronary CTA interpretation by the cardiologist is attached.  COMPARISON:  None.  FINDINGS: 6 mm right lower lobe nodule (image 39 of series 204). Within the visualized  portions of the thorax there is no acute consolidative airspace disease, no pleural effusions, no pneumothorax and no lymphadenopathy. Visualized portions of the upper abdomen are unremarkable. There are no aggressive appearing lytic or blastic lesions noted in the visualized portions of the skeleton.  IMPRESSION: 1. 6 mm right lower lobe pulmonary nodule. If the patient is at high risk for bronchogenic carcinoma, follow-up chest CT at 6-12 months is recommended. If the patient is at low risk for bronchogenic carcinoma, follow-up chest CT at 12 months is recommended. This recommendation follows the consensus statement: Guidelines for Management of Small Pulmonary Nodules Detected on CT Scans: A Statement from the Fleischner Society as published in Cochran 2005;237:395-400.  Electronically Signed: By: Maria Reed M.D. On: 08/04/2015 11:37     Wt Readings from Last 3 Encounters:  07/13/15 83.915 kg (185 lb)  04/07/15 84.278 kg (185 lb 12.8 oz)  09/04/14 85.458 kg (188 lb 6.4 oz)     Past Medical History  Diagnosis Date  . Irregular heart beat   . Colonic polyp   . DDD (degenerative disc disease)   . PVC's (premature ventricular contractions)   . PAC (premature atrial contraction)   . Hypertension   . Hypercholesteremia   .  Vertigo   . Atrial tachycardia, paroxysmal     Current Outpatient Prescriptions  Medication Sig Dispense Refill  . loratadine (CLARITIN) 10 MG tablet Take 10 mg by mouth as needed for allergies.    [name] nebivolol (BYSTOLIC) 5 MG tablet Take 1 tablet (5 mg total) by mouth daily. 30 tablet 11  . pravastatin (PRAVACHOL) 40 MG tablet Take 40 mg by mouth daily.     No current facility-administered medications for this visit.     Allergies:   Allegra; Metoprolol; Penicillins; Sudafed; Sulfa antibiotics; and Tetracyclines & related   Social History:  The patient  reports that she has never smoked. She does not have any smokeless tobacco history on file. She reports  that she drinks alcohol. She reports that she does not use illicit drugs.   Family History:  [name] family history includes CAD in her father; Heart disease in her father; Hypertension in her father; Parkinsonism in her mother.   ROS:  Please see the history of present illness.  All other systems reviewed and negative.    PHYSICAL EXAM: VS:  There were no vitals taken for this visit. Well nourished, well developed, in no acute distress HEENT: normal Neck: no JVD Cardiac:  normal S1, S2; RRR; no murmur Lungs:  clear to auscultation bilaterally, no wheezing, rhonchi or rales Abd: soft, nontender, no hepatomegaly Ext: no edema Skin: warm and dry Neuro:  CNs 2-12 intact, no focal abnormalities noted  EKG:  none   ASSESSMENT AND PLAN:  Maria Cochran is a 59 y.o. female with a history of HTN, PVCs and non sustained atrial tachycardia who presents for pre cath work up after a recent high risk coronary CTA and abnormal nuclear stress test.   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  -- No hx of CVA or bleeding. No allergies or previous dye allergy. -- She has been set up for cardiac cath tomorrow with Dr. Katrinka Blazing at 12:00pm. The patient understands that risks include but are not limited to stroke (1 in 1000), death (1 in 1000), kidney failure [usually temporary] (1 in 500), bleeding (1 in 200), allergic reaction [possibly serious] (1 in 200), and agrees to proceed.  -- She is very anxious about the procedure. She takes Xanax PRN at home. I said it was okay for her to take if she needs it -- She will start ASA  today.   Palpitations- quiescent on increase bystolic. Cont  dosage.  HTN- well controlled today. 122/74   Disposition:  FU will depend on cath tomorrow +/- PCI. Follow up with Dr. Mayford Knife in 2-3 months if no intervention    Signed, Maria Cochran, Maria Cochran 08/10/2015 3:40 PM    [facility] [address] [place], [place], [place]  [address] Phone: [phone]; Fax: [phone]

## 2015-08-12 ENCOUNTER — Other Ambulatory Visit (INDEPENDENT_AMBULATORY_CARE_PROVIDER_SITE_OTHER): Payer: BLUE CROSS/BLUE SHIELD | Admitting: *Deleted

## 2015-08-12 ENCOUNTER — Ambulatory Visit (INDEPENDENT_AMBULATORY_CARE_PROVIDER_SITE_OTHER): Payer: BLUE CROSS/BLUE SHIELD | Admitting: Physician Assistant

## 2015-08-12 ENCOUNTER — Encounter: Payer: Self-pay | Admitting: Physician Assistant

## 2015-08-12 VITALS — BP 122/74 | HR 63 | Ht 67.5 in | Wt 179.4 lb

## 2015-08-12 DIAGNOSIS — I493 Ventricular premature depolarization: Secondary | ICD-10-CM | POA: Diagnosis not present

## 2015-08-12 DIAGNOSIS — R9439 Abnormal result of other cardiovascular function study: Secondary | ICD-10-CM | POA: Diagnosis not present

## 2015-08-12 DIAGNOSIS — I491 Atrial premature depolarization: Secondary | ICD-10-CM

## 2015-08-12 DIAGNOSIS — E7800 Pure hypercholesterolemia, unspecified: Secondary | ICD-10-CM

## 2015-08-12 DIAGNOSIS — I2510 Atherosclerotic heart disease of native coronary artery without angina pectoris: Secondary | ICD-10-CM | POA: Diagnosis not present

## 2015-08-12 DIAGNOSIS — R931 Abnormal findings on diagnostic imaging of heart and coronary circulation: Secondary | ICD-10-CM

## 2015-08-12 DIAGNOSIS — Z01812 Encounter for preprocedural laboratory examination: Secondary | ICD-10-CM

## 2015-08-12 LAB — BASIC METABOLIC PANEL
BUN: 12 mg/dL (ref 7–25)
CHLORIDE: 104 mmol/L (ref 98–110)
CO2: 27 mmol/L (ref 20–31)
CREATININE: 0.7 mg/dL (ref 0.50–1.05)
Calcium: 9.4 mg/dL (ref 8.6–10.4)
Glucose, Bld: 89 mg/dL (ref 65–99)
Potassium: 4.2 mmol/L (ref 3.5–5.3)
Sodium: 140 mmol/L (ref 135–146)

## 2015-08-12 LAB — LIPID PANEL
CHOLESTEROL: 184 mg/dL (ref 125–200)
HDL: 68 mg/dL (ref >=46)
LDL Cholesterol: 96 mg/dL (ref <130)
Total CHOL/HDL Ratio: 2.7 Ratio (ref <=5.0)
Triglycerides: 100 mg/dL (ref <150)
VLDL: 20 mg/dL (ref <30)

## 2015-08-12 LAB — ALT: ALT: 12 U/L (ref 6–29)

## 2015-08-12 LAB — CBC
HCT: 43.4 % (ref 36.0–46.0)
Hemoglobin: 15.2 g/dL — ABNORMAL HIGH (ref 12.0–15.0)
MCH: 31.1 pg (ref 26.0–34.0)
MCHC: 35 g/dL (ref 30.0–36.0)
MCV: 88.9 fL (ref 78.0–100.0)
MPV: 10.2 fL (ref 8.6–12.4)
PLATELETS: 226 10*3/uL (ref 150–400)
RBC: 4.88 MIL/uL (ref 3.87–5.11)
RDW: 13.5 % (ref 11.5–15.5)
WBC: 5.1 10*3/uL (ref 4.0–10.5)

## 2015-08-12 NOTE — Patient Instructions (Addendum)
Medication Instructions:   Your physician recommends that you continue on your current medications as directed. Please refer to the Current Medication list given to you today.   Labwork:   BMET CBC PT /INR PTT  Testing/Procedures:   *YOU HAVE BEEN SCHEDULED FOR A LEFT HEART CATH     ON  10   /7  / 16      @  12PM  *PLEASE ARRIVE @ 10 AM    NORTH TOWER ENTRANCE TO BE DIRECTED  TO        ADMITTING  *PLEASE HAVE NOTHING TO EAST OR DRINK AFTER MIDNIGHT THE NIGHT      BEFORE YOUR PROCEDURE   *YOU MAY TAKE ALL YOUR MORNING MEDS WITH A SMALL AMOUNT OF WATER   * MAKE SURE YOU HAVE A CHANGE OF CLOTHING JUST  IN CASE YOU MAY    Follow-Up:  POST FOLLOW UP WITH TURNER 3 MONTHS    Any Other Special Instructions Will Be Listed Below (If Applicable).

## 2015-08-13 ENCOUNTER — Encounter (HOSPITAL_COMMUNITY)
Admission: RE | Disposition: A | Discharge status: Home / Self Care | Payer: Self-pay | Source: Ambulatory Visit | Attending: Interventional Cardiology

## 2015-08-13 ENCOUNTER — Ambulatory Visit (HOSPITAL_COMMUNITY)
Admission: RE | Admit: 2015-08-13 | Discharge: 2015-08-13 | Disposition: A | Discharge status: Home / Self Care | Payer: BLUE CROSS/BLUE SHIELD | Source: Ambulatory Visit | Attending: Interventional Cardiology | Admitting: Interventional Cardiology

## 2015-08-13 DIAGNOSIS — I10 Essential (primary) hypertension: Secondary | ICD-10-CM | POA: Diagnosis not present

## 2015-08-13 DIAGNOSIS — I471 Supraventricular tachycardia: Secondary | ICD-10-CM | POA: Diagnosis not present

## 2015-08-13 DIAGNOSIS — I2510 Atherosclerotic heart disease of native coronary artery without angina pectoris: Secondary | ICD-10-CM | POA: Insufficient documentation

## 2015-08-13 DIAGNOSIS — Z8249 Family history of ischemic heart disease and other diseases of the circulatory system: Secondary | ICD-10-CM | POA: Diagnosis not present

## 2015-08-13 DIAGNOSIS — E7800 Pure hypercholesterolemia, unspecified: Secondary | ICD-10-CM | POA: Diagnosis not present

## 2015-08-13 DIAGNOSIS — R931 Abnormal findings on diagnostic imaging of heart and coronary circulation: Secondary | ICD-10-CM

## 2015-08-13 DIAGNOSIS — R9439 Abnormal result of other cardiovascular function study: Secondary | ICD-10-CM | POA: Diagnosis present

## 2015-08-13 HISTORY — PX: CARDIAC CATHETERIZATION: SHX172

## 2015-08-13 LAB — PROTIME-INR
INR: 0.95 (ref <1.50)
Prothrombin Time: 12.8 seconds (ref 11.6–15.2)

## 2015-08-13 LAB — APTT: APTT: 33 s (ref 24–37)

## 2015-08-13 SURGERY — LEFT HEART CATH AND CORONARY ANGIOGRAPHY

## 2015-08-13 MED ORDER — VERAPAMIL HCL 2.5 MG/ML IV SOLN
INTRAVENOUS | Status: AC
  Filled 2015-08-13: qty 2

## 2015-08-13 MED ORDER — SODIUM CHLORIDE 0.9 % IJ SOLN: 3.0000 mL | INTRAMUSCULAR | Status: DC | PRN

## 2015-08-13 MED ORDER — SODIUM CHLORIDE 0.9 % WEIGHT BASED INFUSION: 3.0000 mL/kg/h | INTRAVENOUS | Status: DC

## 2015-08-13 MED ORDER — HEPARIN SODIUM (PORCINE) 1000 UNIT/ML IJ SOLN
INTRAMUSCULAR | Status: DC | PRN
  Administered 2015-08-13: 4000 [IU] via INTRAVENOUS

## 2015-08-13 MED ORDER — HEPARIN (PORCINE) IN NACL 2-0.9 UNIT/ML-% IJ SOLN
INTRAMUSCULAR | Status: DC | PRN
  Administered 2015-08-13: 13:00:00

## 2015-08-13 MED ORDER — MIDAZOLAM HCL 2 MG/2ML IJ SOLN
INTRAMUSCULAR | Status: AC
  Filled 2015-08-13: qty 4

## 2015-08-13 MED ORDER — ASPIRIN 81 MG PO CHEW
CHEWABLE_TABLET | ORAL | Status: AC
  Filled 2015-08-13: qty 1

## 2015-08-13 MED ORDER — VERAPAMIL HCL 2.5 MG/ML IV SOLN
INTRAVENOUS | Status: DC | PRN
  Administered 2015-08-13: 12:00:00 via INTRA_ARTERIAL

## 2015-08-13 MED ORDER — ONDANSETRON HCL 4 MG/2ML IJ SOLN: 4.0000 mg | Freq: Four times a day (QID) | INTRAMUSCULAR | Status: DC | PRN

## 2015-08-13 MED ORDER — FENTANYL CITRATE (PF) 100 MCG/2ML IJ SOLN
INTRAMUSCULAR | Status: AC
  Filled 2015-08-13: qty 4

## 2015-08-13 MED ORDER — IOHEXOL 350 MG/ML SOLN
INTRAVENOUS | Status: DC | PRN
  Administered 2015-08-13: 80 mL via INTRACARDIAC

## 2015-08-13 MED ORDER — SODIUM CHLORIDE 0.9 % IV SOLN: 250.0000 mL | INTRAVENOUS | Status: DC | PRN

## 2015-08-13 MED ORDER — ASPIRIN 81 MG PO CHEW
81.0000 mg | CHEWABLE_TABLET | ORAL | Status: AC
  Administered 2015-08-13: 81 mg via ORAL

## 2015-08-13 MED ORDER — SODIUM CHLORIDE 0.9 % IJ SOLN: 3.0000 mL | Freq: Two times a day (BID) | INTRAMUSCULAR | Status: DC

## 2015-08-13 MED ORDER — SODIUM CHLORIDE 0.9 % WEIGHT BASED INFUSION: 1.0000 mL/kg/h | INTRAVENOUS | Status: DC

## 2015-08-13 MED ORDER — SODIUM CHLORIDE 0.9 % WEIGHT BASED INFUSION
3.0000 mL/kg/h | INTRAVENOUS | Status: AC
  Administered 2015-08-13: 3 mL/kg/h via INTRAVENOUS

## 2015-08-13 MED ORDER — ACETAMINOPHEN 325 MG PO TABS: 650.0000 mg | ORAL_TABLET | ORAL | Status: DC | PRN

## 2015-08-13 MED ORDER — LIDOCAINE HCL (PF) 1 % IJ SOLN
INTRAMUSCULAR | Status: AC
  Filled 2015-08-13: qty 30

## 2015-08-13 MED ORDER — FENTANYL CITRATE (PF) 100 MCG/2ML IJ SOLN
INTRAMUSCULAR | Status: DC | PRN
  Administered 2015-08-13: 50 ug via INTRAVENOUS

## 2015-08-13 MED ORDER — HEPARIN (PORCINE) IN NACL 2-0.9 UNIT/ML-% IJ SOLN
INTRAMUSCULAR | Status: AC
  Filled 2015-08-13: qty 1000

## 2015-08-13 MED ORDER — MIDAZOLAM HCL 2 MG/2ML IJ SOLN
INTRAMUSCULAR | Status: DC | PRN
  Administered 2015-08-13 (×2): 1 mg via INTRAVENOUS

## 2015-08-13 SURGICAL SUPPLY — 9 items
CATH INFINITI 5 FR JL3.5 (CATHETERS) ×2 IMPLANT
CATH INFINITI JR4 5F (CATHETERS) ×2 IMPLANT
DEVICE RAD COMP TR BAND LRG (VASCULAR PRODUCTS) ×2 IMPLANT
GLIDESHEATH SLEND A-KIT 6F 22G (SHEATH) ×2 IMPLANT
KIT HEART LEFT (KITS) ×2 IMPLANT
PACK CARDIAC CATHETERIZATION (CUSTOM PROCEDURE TRAY) ×2 IMPLANT
TRANSDUCER W/STOPCOCK (MISCELLANEOUS) ×2 IMPLANT
TUBING CIL FLEX 10 FLL-RA (TUBING) ×2 IMPLANT
WIRE SAFE-T 1.5MM-J .035X260CM (WIRE) ×2 IMPLANT

## 2015-08-13 NOTE — Interval H&P Note (Signed)
Cath Lab Visit (complete for each Cath Lab visit)  Clinical Evaluation Leading to the Procedure:   ACS: No.  Non-ACS:    Anginal Classification: CCS III  Anti-ischemic medical therapy: Minimal Therapy (1 class of medications)  Non-Invasive Test Results: Intermediate-risk stress test findings: cardiac mortality 1-3%/year  Prior CABG: No previous CABG      History and Physical Interval Note:  08/13/2015 11:52 AM  [name]  has presented today for surgery, with the diagnosis of abnormal stress  The various methods of treatment have been discussed with the patient and family. After consideration of risks, benefits and other options for treatment, the patient has consented to  Procedure(s): Left Heart Cath and Coronary Angiography (N/A) as a surgical intervention .  [name] history has been reviewed, patient examined, no change in status, stable for surgery.  I have reviewed the patient's chart and labs.  Questions were answered to the patient's satisfaction.     Lesleigh Noe

## 2015-08-13 NOTE — H&P (View-Only) (Signed)
Cardiology Office Note    Date:  08/10/2015   ID:  MIZANI DILDAY, DOB December 09, 1955, MRN [id number]  PCP:  Thora Lance, MD  Cardiologist:  Dr. Mayford Knife     History of Present Illness: Maria Cochran is a 59 y.o. female with a history of HTN, PVCs and non sustained atrial tachycardia who presents for pre cath work up after a recent high risk coronary CTA and abnormal nuclear stress test.   She was last seen by Dr Mayford Knife on 04/07/15 for follow up of palpiations that were being controlled on Bystolic. Due to a family history of CAD in her father an exercise stress test was ordered. Subsequently, a telephone note reveals that she had further palpitations. A cardiac monitor was ordered which revewaled PVCs and Non sustained atrial tach. Her Bystolic was increased to  daily. She underwent POET on 07/06/15 which returned abnormal. She was subsequently set up for ETT Nuc which also returned abnormal ( see report below). Dr. Mayford Knife felt abnormal EKG could be false positive ETT and ordered a coronary CTA with morphology to assess further. 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   She was asked to come into the office today to discuss cardiac catheterization. Patient denies any chest discomfort or SOB. She denies exertional CP or DOE. 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 She occasionally has bilateral breast tightness that she thinks is related to menopause. Sometimes she has a pinpoint pressure sensation in her chest under her breast.  Her palpitations have been well controlled on increased bystolic. No Le edema, orthopnea, PND or dizziness. No hx of CVA or bleeding. No allergies.   Studies:  - Nuclear (07/13/15):    The left ventricular ejection fraction is hyperdynamic (>65%).  Nuclear stress EF: 75%.  Horizontal ST segment depression ST segment depression was noted during stress in the V4, V5,  V6, II, III and aVF leads, and returning to baseline after 1-5 minutes of recovery.  Defect 1: There is a medium defect of moderate severity present in the mid anteroseptal, apical anterior and apical septal location.  Findings consistent with prior myocardial infarction with peri-infarct ischemia.  This is an intermediate risk study with concerning ETT as well as possible LAD perfusion d  - Cardiac CT (08/05/15):  Impressions: 1. Coronary calcium score of 519. This was 86 percentile for age and sex matched control. 2. Normal coronary origin. Right dominance. 3. There is moderate diffuse plaque in RCA and LAD with high risk features in the mid LAD plaque. An aggressive risk factor modification is recommended.   Recent Labs/Images:  No results for input(s): NA, K, BUN, CREATININE, ALT, HGB, TSH, LDLCALC, LDLDIRECT, HDL, BNP, PROBNP in the last 8760 hours.  Invalid input(s): LDL   Ct Coronary Morp W/cta Cor W/score W/ca W/cm &/or Wo/cm  08/05/2015   ADDENDUM REPORT: 08/05/2015 16:37 CLINICAL DATA:  59 year old female with abnormal stress test and no chest pain. EXAM: Cardiac/Coronary  CT TECHNIQUE: The patient was scanned on a Philips 256 scanner. FINDINGS: A 120 kV prospective scan was triggered in the descending thoracic aorta at 111 HU's. Axial non-contrast 3 mm slices were carried out through the heart. The data set was analyzed on a dedicated work station and scored using the Agatson method. Gantry rotation speed was 270 msecs and collimation was .9 mm. No beta blockade and 0.8 mg of sl NTG  was given. The 3D data set was reconstructed in 5% intervals of the 67-82 % of the R-R cycle. Diastolic phases were analyzed on a dedicated work station using MPR, MIP and VRT modes. The patient received 80 cc of contrast. Aorta:  No calcifications.  Normal caliber.  No dissection. Aortic Valve:  Trileaflet.  No calcifications. Coronary Arteries:  Normal coronary origin.  Right dominance. Left main  artery has mild calcified plaque in its distal portion associated with 25-50% stenosis that is extending into proximal LAD. LAD is a large vessel that gives rise to two diagonal branches. There is a long moderate mixed, predominantly calcified plaque in mid LAD associated with 50-69% stenosis. This plaque has a high risk feature (napkin ring sign). There is no significant plaque in distal LAD. D1 has minimal ostial plaque. D2 has no obvious plaque. LCX artery is small non-dominant and gives rise to one obtuse marginal branch. There is minimal calcified plaque in the ostial LCX artery with associated stenosis 0-25%. RCA is a large dominant vessel that gives rise to PDA and PLA. There is a moderate diffuse mixed, predominantly calcified plaque in the proximal and mid RCA with maximum stenosis 50-69%. PDA has moderate calcified plaque in its distal portion associated with 50-69%. PLA is small with no obvious stenosis. Other findings: Normal pulmonary vein drainage. No ASD or VSD found. Normal size of the atrial appendage with no filling defect. Normal size of the pulmonary artery. IMPRESSION: 1. Coronary calcium score of 519. This was 23 percentile for age and sex matched control. 2. Normal coronary origin.  Right dominance. 3. There is moderate diffuse plaque in RCA and LAD with high risk features in the mid LAD plaque. An aggressive risk factor modification is recommended. Tobias Alexander Electronically Signed   By: Tobias Alexander   On: 08/05/2015 16:37  08/05/2015   EXAM: OVER-READ INTERPRETATION  CT CHEST  The following report is an over-read performed by radiologist Dr. Royal Piedra [facility] on 08/04/2015. This over-read does not include interpretation of cardiac or coronary anatomy or pathology. The coronary calcium score/coronary CTA interpretation by the cardiologist is attached.  COMPARISON:  None.  FINDINGS: 6 mm right lower lobe nodule (image 39 of series 204). Within the visualized  portions of the thorax there is no acute consolidative airspace disease, no pleural effusions, no pneumothorax and no lymphadenopathy. Visualized portions of the upper abdomen are unremarkable. There are no aggressive appearing lytic or blastic lesions noted in the visualized portions of the skeleton.  IMPRESSION: 1. 6 mm right lower lobe pulmonary nodule. If the patient is at high risk for bronchogenic carcinoma, follow-up chest CT at 6-12 months is recommended. If the patient is at low risk for bronchogenic carcinoma, follow-up chest CT at 12 months is recommended. This recommendation follows the consensus statement: Guidelines for Management of Small Pulmonary Nodules Detected on CT Scans: A Statement from the Fleischner Society as published in Radiology 2005;237:395-400.  Electronically Signed: By: Trudie Reed M.D. On: 08/04/2015 11:37     Wt Readings from Last 3 Encounters:  07/13/15 83.915 kg (185 lb)  04/07/15 84.278 kg (185 lb 12.8 oz)  09/04/14 85.458 kg (188 lb 6.4 oz)     Past Medical History  Diagnosis Date  . Irregular heart beat   . Colonic polyp   . DDD (degenerative disc disease)   . PVC's (premature ventricular contractions)   . PAC (premature atrial contraction)   . Hypertension   . Hypercholesteremia   .  Vertigo   . Atrial tachycardia, paroxysmal     Current Outpatient Prescriptions  Medication Sig Dispense Refill  . loratadine (CLARITIN) 10 MG tablet Take 10 mg by mouth as needed for allergies.    [name] nebivolol (BYSTOLIC) 5 MG tablet Take 1 tablet (5 mg total) by mouth daily. 30 tablet 11  . pravastatin (PRAVACHOL) 40 MG tablet Take 40 mg by mouth daily.     No current facility-administered medications for this visit.     Allergies:   Allegra; Metoprolol; Penicillins; Sudafed; Sulfa antibiotics; and Tetracyclines & related   Social History:  The patient  reports that she has never smoked. She does not have any smokeless tobacco history on file. She reports  that she drinks alcohol. She reports that she does not use illicit drugs.   Family History:  [name] family history includes CAD in her father; Heart disease in her father; Hypertension in her father; Parkinsonism in her mother.   ROS:  Please see the history of present illness.  All other systems reviewed and negative.    PHYSICAL EXAM: VS:  There were no vitals taken for this visit. Well nourished, well developed, in no acute distress HEENT: normal Neck: no JVD Cardiac:  normal S1, S2; RRR; no murmur Lungs:  clear to auscultation bilaterally, no wheezing, rhonchi or rales Abd: soft, nontender, no hepatomegaly Ext: no edema Skin: warm and dry Neuro:  CNs 2-12 intact, no focal abnormalities noted  EKG:  none   ASSESSMENT AND PLAN:  Maria Cochran is a 59 y.o. female with a history of HTN, PVCs and non sustained atrial tachycardia who presents for pre cath work up after a recent high risk coronary CTA and abnormal nuclear stress test.   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  -- No hx of CVA or bleeding. No allergies or previous dye allergy. -- She has been set up for cardiac cath tomorrow with Dr. Katrinka Blazing at 12:00pm. The patient understands that risks include but are not limited to stroke (1 in 1000), death (1 in 1000), kidney failure [usually temporary] (1 in 500), bleeding (1 in 200), allergic reaction [possibly serious] (1 in 200), and agrees to proceed.  -- She is very anxious about the procedure. She takes Xanax PRN at home. I said it was okay for her to take if she needs it -- She will start ASA  today.   Palpitations- quiescent on increase bystolic. Cont  dosage.  HTN- well controlled today. 122/74   Disposition:  FU will depend on cath tomorrow +/- PCI. Follow up with Dr. Mayford Knife in 2-3 months if no intervention    Signed, Teddy Spike, MHS 08/10/2015 3:40 PM    [facility] [address] [place], [place], [place]  [address] Phone: [phone]; Fax: [phone]

## 2015-08-13 NOTE — Discharge Instructions (Signed)
Radial Site Care °Refer to this sheet in the next few weeks. These instructions provide you with information about caring for yourself after your procedure. Your health care provider may also give you more specific instructions. Your treatment has been planned according to current medical practices, but problems sometimes occur. Call your health care provider if you have any problems or questions after your procedure. °WHAT TO EXPECT AFTER THE PROCEDURE °After your procedure, it is typical to have the following: °· Bruising at the radial site that usually fades within 1-2 weeks. °· Blood collecting in the tissue (hematoma) that may be painful to the touch. It should usually decrease in size and tenderness within 1-2 weeks. °HOME CARE INSTRUCTIONS °· Take medicines only as directed by your health care provider. °· You may shower 24-48 hours after the procedure or as directed by your health care provider. Remove the bandage (dressing) and gently wash the site with plain soap and water. Pat the area dry with a clean towel. Do not rub the site, because this may cause bleeding. °· Do not take baths, swim, or use a hot tub until your health care provider approves. °· Check your insertion site every day for redness, swelling, or drainage. °· Do not apply powder or lotion to the site. °· Do not flex or bend the affected arm for 24 hours or as directed by your health care provider. °· Do not push or pull heavy objects with the affected arm for 24 hours or as directed by your health care provider. °· Do not lift over 10 lb (4.5 kg) for 5 days after your procedure or as directed by your health care provider. °· Ask your health care provider when it is okay to: °¨ Return to work or school. °¨ Resume usual physical activities or sports. °¨ Resume sexual activity. °· Do not drive home if you are discharged the same day as the procedure. Have someone else drive you. °· You may drive 24 hours after the procedure unless otherwise  instructed by your health care provider. °· Do not operate machinery or power tools for 24 hours after the procedure. °· If your procedure was done as an outpatient procedure, which means that you went home the same day as your procedure, a responsible adult should be with you for the first 24 hours after you arrive home. °· Keep all follow-up visits as directed by your health care provider. This is important. °SEEK MEDICAL CARE IF: °· You have a fever. °· You have chills. °· You have increased bleeding from the radial site. Hold pressure on the site. °SEEK IMMEDIATE MEDICAL CARE IF: °· You have unusual pain at the radial site. °· You have redness, warmth, or swelling at the radial site. °· You have drainage (other than a small amount of blood on the dressing) from the radial site. °· The radial site is bleeding, and the bleeding does not stop after 30 minutes of holding steady pressure on the site. °· Your arm or hand becomes pale, cool, tingly, or numb. °  °This information is not intended to replace advice given to you by your health care provider. Make sure you discuss any questions you have with your health care provider. °  °Document Released: 11/25/2010 Document Revised: 11/13/2014 Document Reviewed: 05/11/2014 °Elsevier Interactive Patient Education ©2016 Elsevier Inc. ° °

## 2015-08-16 ENCOUNTER — Encounter (HOSPITAL_COMMUNITY): Payer: Self-pay | Admitting: Interventional Cardiology

## 2015-08-17 ENCOUNTER — Ambulatory Visit (INDEPENDENT_AMBULATORY_CARE_PROVIDER_SITE_OTHER): Payer: BLUE CROSS/BLUE SHIELD | Admitting: Nurse Practitioner

## 2015-08-17 ENCOUNTER — Encounter: Payer: Self-pay | Admitting: Nurse Practitioner

## 2015-08-17 VITALS — BP 122/64 | HR 59 | Ht 67.5 in | Wt 180.4 lb

## 2015-08-17 DIAGNOSIS — I10 Essential (primary) hypertension: Secondary | ICD-10-CM

## 2015-08-17 DIAGNOSIS — E7800 Pure hypercholesterolemia, unspecified: Secondary | ICD-10-CM

## 2015-08-17 DIAGNOSIS — Z9889 Other specified postprocedural states: Secondary | ICD-10-CM | POA: Diagnosis not present

## 2015-08-17 DIAGNOSIS — Z8249 Family history of ischemic heart disease and other diseases of the circulatory system: Secondary | ICD-10-CM | POA: Diagnosis not present

## 2015-08-17 MED ORDER — NITROGLYCERIN 0.4 MG SL SUBL: 0.4000 mg | SUBLINGUAL_TABLET | SUBLINGUAL | Status: DC | PRN

## 2015-08-17 NOTE — Progress Notes (Signed)
CARDIOLOGY OFFICE NOTE  Date:  08/17/2015    Maria Cochran Date of Birth: 11/20/1955 Medical Record #[id number]  PCP:  Thora Lance, MD  Cardiologist:  Mayford Knife    Chief Complaint  Patient presents with  . Post cardiac catheterization    Seen for Dr. Mayford Knife    History of Present Illness: Maria Cochran is a 59 y.o. female who presents today for a post cardiac cath visit. Seen for Dr. Mayford Knife. She has a history of HTN, PVCs and non sustained atrial tachycardia.    She was last seen by Dr Mayford Knife on 04/07/15 for follow up of palpitations that were being controlled on Bystolic. Due to a family history of CAD in her father an exercise stress test was ordered. Subsequently, a telephone note reveals that she had further palpitations. A cardiac monitor was ordered which revewaled PVCs and Non sustained atrial tach. Her Bystolic was increased to  daily. She underwent POET on 07/06/15 which returned abnormal. She was subsequently set up for ETT Nuc which also returned abnormal ( see report below). Dr. Mayford Knife felt abnormal EKG could be false positive ETT and ordered a coronary CTA with morphology to assess further. 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 She was then referred on for cardiac cath - see below.   Comes in today. Here alone. She is doing ok. No chest pain.Not short of breath. Still with some palpitations but nothing that makes her feel bad. Lots of questions about prognosis, findings, restrictions and recommendations. Some bruising of her cath site and it is sore. 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   Past Medical History  Diagnosis Date  . Irregular heart beat   . Colonic polyp   . DDD (degenerative disc disease)   . PVC's (premature ventricular contractions)   . PAC (premature atrial contraction)   . Hypertension   . Hypercholesteremia    . Vertigo   . Atrial tachycardia, paroxysmal [facility])     Past Surgical History  Procedure Laterality Date  . Tonsillectomy    . Cardiac catheterization N/A 08/13/2015    Procedure: Left Heart Cath and Coronary Angiography;  Surgeon: Lyn Records, MD;  Location: [facility] INVASIVE CV LAB;  Service: Cardiovascular;  Laterality: N/A;     Medications: Current Outpatient Prescriptions  Medication Sig Dispense Refill  . aspirin 81 MG tablet Take 81 mg by mouth daily.    [name] atorvastatin (LIPITOR) 40 MG tablet Take 40 mg by mouth daily.    [name] ALPRAZolam (XANAX) 0.5 MG tablet Take 0.125-0.25 mg by mouth at bedtime as needed for anxiety or sleep.    [name] loratadine (CLARITIN) 10 MG tablet Take 10 mg by mouth daily as needed for allergies.     [name] nebivolol (BYSTOLIC) 5 MG tablet Take 1 tablet (5 mg total) by mouth daily. 30 tablet 11  . Polyethyl Glycol-Propyl Glycol (SYSTANE OP) Place 2 drops into both eyes as needed (for dry eyes).     No current facility-administered medications for this visit.    Allergies: Allergies  Allergen Reactions  . Sudafed [Pseudoephedrine Hcl] Other (See Comments)    Heart rate goes really fast   . Metoprolol Other (See Comments)    Severe fatigue   . Morphine And Related Other (See Comments)    Nausea and severe anxiety  . Allegra [Fexofenadine Hcl]  Other (See Comments)    achey   . Penicillins Swelling  . Sulfa Antibiotics Itching  . Tetracyclines & Related Swelling    Social History: The patient  reports that she has never smoked. She does not have any smokeless tobacco history on file. She reports that she drinks alcohol. She reports that she does not use illicit drugs.   Family History: [name] family history includes CAD in her father; Heart disease in her father; Hypertension in her father; Parkinsonism in her mother.   Review of Systems: Please see the history of present illness.   Otherwise, the review of systems is positive for none.   All  other systems are reviewed and negative.   Physical Exam: VS:  BP 122/64 mmHg  Pulse 59  Ht 5' 7.5" (1.715 m)  Wt 180 lb 6.4 oz (81.829 kg)  BMI 27.82 kg/m2  SpO2 98% .  BMI Body mass index is 27.82 kg/(m^2).  Wt Readings from Last 3 Encounters:  08/17/15 180 lb 6.4 oz (81.829 kg)  08/13/15 179 lb (81.194 kg)  08/12/15 179 lb 6.4 oz (81.375 kg)    General: Pleasant. Well developed, well nourished and in no acute distress.  HEENT: Normal. Neck: Supple, no JVD, carotid bruits, or masses noted.  Cardiac: Regular rate and rhythm. No murmurs, rubs, or gallops. No edema.  Respiratory:  Lungs are clear to auscultation bilaterally with normal work of breathing.  GI: Soft and nontender.  MS: No deformity or atrophy. Gait and ROM intact. Skin: Warm and dry. Color is normal.  Neuro:  Strength and sensation are intact and no gross focal deficits noted.  Psych: Alert, appropriate and with normal affect. Right wrist with mild bruising. Site is ok.   LABORATORY DATA:  EKG:  EKG is not ordered today.   Lab Results  Component Value Date   WBC 5.1 08/12/2015   HGB 15.2* 08/12/2015   HCT 43.4 08/12/2015   PLT 226 08/12/2015   GLUCOSE 89 08/12/2015   CHOL 184 08/12/2015   TRIG 100 08/12/2015   HDL 68 08/12/2015   LDLCALC 96 08/12/2015   ALT 12 08/12/2015   NA 140 08/12/2015   K 4.2 08/12/2015   CL 104 08/12/2015   CREATININE 0.70 08/12/2015   BUN 12 08/12/2015   CO2 27 08/12/2015   INR 0.95 08/12/2015    BNP (last 3 results) No results for input(s): BNP in the last 8760 hours.  ProBNP (last 3 results) No results for input(s): PROBNP in the last 8760 hours.   Other Studies Reviewed Today: Cardiac Cath Conclusion from 08/2015    Ramus lesion, 50% stenosed.  Prox LAD to Mid LAD lesion, 50% stenosed.  Ost 1st Diag lesion, 40% stenosed.  Dist Cx lesion, 45% stenosed.  Prox RCA to Mid RCA lesion, 30% stenosed.  RPDA lesion, 75% stenosed.   Nonobstructive , 50%,  mid left anterior descending coronary disease. Luminal irregularities in the distal circumflex.  75% mid PDA stenosis. No evidence of inferior ischemia on cardio perfusion imaging. No symptoms of angina. Medical therapy indicated.  Normal left ventricular function.    RECOMMENDATIONS:  Aggressive risk factor modification   Myoview Study Highlights     The left ventricular ejection fraction is hyperdynamic (>65%).  Nuclear stress EF: 75%.  Horizontal ST segment depression ST segment depression was noted during stress in the V4, V5, V6, II, III and aVF leads, and returning to baseline after 1-5 minutes of recovery.  Defect 1: There is a  medium defect of moderate severity present in the mid anteroseptal, apical anterior and apical septal location.  Findings consistent with prior myocardial infarction with peri-infarct ischemia.  This is an intermediate risk study with concerning ETT as well as possible LAD perfusion defect.     Assessment/Plan: 1. S/P cardiac cath following abnormal GXT/myoview/Cardiac CT - to manage with aggressive CV risk factor modification which was discussed in depth with her today. No chest pain. I did send in a RX for NTG for her to have on hand. See back as planned. Will let her return to work on Sunday - letter given.   2. HLD - now on Lipitor by PCP - goal LDL is less than 70  3. Obesity - understands need for regular exercise and weight loss  4. Palpitations - not really too bothersome for her.  5. +FH for CAD   Current medicines are reviewed with the patient today.  The patient does not have concerns regarding medicines other than what has been noted above.  The following changes have been made:  See above.  Labs/ tests ordered today include:   No orders of the defined types were placed in this encounter.     Disposition:   FU with Dr. Mayford Knife in January as planned.   Patient is agreeable to this plan and will call if any problems  develop in the interim.   Signed: Rosalio Macadamia, RN, ANP-C 08/17/2015 12:11 PM  [facility] [address] [place], [place]  [address] Phone: [phone] Fax: [phone]

## 2015-08-17 NOTE — Patient Instructions (Addendum)
We will be checking the following labs today - NONE   Medication Instructions:    Continue with your current medicines.  I sent in a RX for NTG - Use your NTG under your tongue for recurrent chest pain. May take one tablet every 5 minutes. If you are still having discomfort after 3 tablets in 15 minutes, call 911.  Testing/Procedures To Be Arranged:  N/A  Follow-Up:   See Dr. Mayford Knife as planned in January    Other Special Instructions:   Walking every day - goal is up to one hour a day  Heart healthy diet  Call the [facility] office at [phone] if you have any questions, problems or concerns.

## 2015-10-18 DIAGNOSIS — M7711 Lateral epicondylitis, right elbow: Secondary | ICD-10-CM | POA: Insufficient documentation

## 2015-10-18 DIAGNOSIS — M7710 Lateral epicondylitis, unspecified elbow: Secondary | ICD-10-CM | POA: Insufficient documentation

## 2015-11-11 ENCOUNTER — Encounter: Payer: Self-pay | Admitting: Cardiology

## 2015-11-11 DIAGNOSIS — I2510 Atherosclerotic heart disease of native coronary artery without angina pectoris: Secondary | ICD-10-CM | POA: Insufficient documentation

## 2015-11-11 NOTE — Progress Notes (Signed)
Cardiology Office Note   Date:  11/12/2015   ID:  Maria FailLynda G Breon, DOB 22-Oct-1956, MRN [id number]  PCP:  Thora LanceEHINGER,ROBERT R, MD    Chief Complaint  Patient presents with  . Coronary Artery Disease  . Hypertension  . Irregular Heart Beat      History of Present Illness: Maria Cochran is a 60 y.o. female with a history of PVC's and nonsustained atrial tachycardia dyslipidemia and borderline obstructive ASCAD of the RPDA of 75% with no ischemia on nuclear stress test and nonobstructive elsewhere by cath on medical management. She is doing well. She denies any anginal chest pain, SOB, DOE, LE edema, dizziness, or syncope. She rarely will notice a skipped a beat.  She is walking daily for 45 minutes with no problems.  She has lost 6lbs since the fall.      Past Medical History  Diagnosis Date  . Irregular heart beat   . Colonic polyp   . DDD (degenerative disc disease)   . PVC's (premature ventricular contractions)   . PAC (premature atrial contraction)   . Hypertension   . Hypercholesteremia   . Vertigo   . Atrial tachycardia, paroxysmal (HCC)   . Coronary artery disease 2016    50% prox to mid LAD, 50% ramus, 40% ostial D1, 45% distal LCX and 30% prox to mid RCA and 75% RPDA with no ischemia in inferior wall on nuclear stress test - on medical management with no angina.    Past Surgical History  Procedure Laterality Date  . Tonsillectomy    . Cardiac catheterization N/A 08/13/2015    Procedure: Left Heart Cath and Coronary Angiography;  Surgeon: Lyn RecordsHenry W Smith, MD;  [facility];  Service: Cardiovascular;  Laterality: N/A;     Current Outpatient Prescriptions  Medication Sig Dispense Refill  . ALPRAZolam (XANAX) 0.5 MG tablet Take 0.125-0.25 mg by mouth at bedtime as needed for anxiety or sleep.    [name]. aspirin 81 MG tablet Take 81 mg by mouth daily.    [name]. atorvastatin (LIPITOR) 40 MG tablet Take 40 mg by mouth daily.    [name]. loratadine (CLARITIN)  10 MG tablet Take 10 mg by mouth daily as needed for allergies.     [name]. nebivolol (BYSTOLIC) 5 MG tablet Take 1 tablet (5 mg total) by mouth daily. 30 tablet 11  . nitroGLYCERIN (NITROSTAT) 0.4 MG SL tablet Place 1 tablet (0.4 mg total) under the tongue every 5 (five) minutes as needed for chest pain. 25 tablet 3  . Polyethyl Glycol-Propyl Glycol (SYSTANE OP) Place 2 drops into both eyes as needed (for dry eyes).     No current facility-administered medications for this visit.    Allergies:   Sudafed; Metoprolol; Buprenorphine hcl; Morphine and related; Allegra; Doxycycline monohydrate; Penicillins; Sulfa antibiotics; Sulfacetamide sodium; and Tetracyclines & related    Social History:  The patient  reports that she has never smoked. She does not have any smokeless tobacco history on file. She reports that she drinks alcohol. She reports that she does not use illicit drugs.   Family History:  [name] family history includes CAD in her father; Heart disease in her father; Hypertension in her father; Parkinsonism in her mother.    ROS:  Please see the history of present illness.   Otherwise, review of systems are positive for none.   All other systems are reviewed  and negative.    PHYSICAL EXAM: VS:  BP 106/64 mmHg  Pulse 61  Ht 5' 7.5" (1.715 m)  Wt 174 lb 3.2 oz (79.017 kg)  BMI 26.87 kg/m2  SpO2 98% , BMI Body mass index is 26.87 kg/(m^2). GEN: Well nourished, well developed, in no acute distress HEENT: normal Neck: no JVD, carotid bruits, or masses Cardiac: RRR; no murmurs, rubs, or gallops,no edema  Respiratory:  clear to auscultation bilaterally, normal work of breathing GI: soft, nontender, nondistended, + BS MS: no deformity or atrophy Skin: warm and dry, no rash Neuro:  Strength and sensation are intact Psych: euthymic mood, full affect   EKG:  EKG is not ordered today.    Recent Labs: 08/12/2015: ALT 12; BUN 12; Creat 0.70; Hemoglobin 15.2*; Platelets 226;  Potassium 4.2; Sodium 140    Lipid Panel    Component Value Date/Time   CHOL 184 08/12/2015 1057   TRIG 100 08/12/2015 1057   HDL 68 08/12/2015 1057   CHOLHDL 2.7 08/12/2015 1057   VLDL 20 08/12/2015 1057   LDLCALC 96 08/12/2015 1057      Wt Readings from Last 3 Encounters:  11/12/15 174 lb 3.2 oz (79.017 kg)  08/17/15 180 lb 6.4 oz (81.829 kg)  08/13/15 179 lb (81.194 kg)      ASSESSMENT AND PLAN:  1. PVC's controlled on beta blockers  - continue Bystolic but at 2.5mg  daily due to low BP.  I told her she could take an extra 1/2 tablet as needed if palpitations increase.   2. Nonsustained atrial tach controlled on BB 3. HTN well controlled and actually on the soft side.  Complains occasionally of weakness when working.   -Decrease Bystolic to 2.5mg  daily  4.ASCAD with 50% prox to mid LAD, 50% ramus, 40% ostial D1, 45% distal LCX and 30% prox to mid RCA and 75% RPDA with no ischemia in inferior wall on nuclear stress test - on medical management with no angina.  Continue ASA/statin/BB.   5.  Dyslipidemia - continue statin.  I will get a copy of the FLP and ALT from PCP   Current medicines are reviewed at length with the patient today.  The patient does not have concerns regarding medicines.  The following changes have been made:  Decrease Bystolic to 2.5mg  daily  Labs/ tests ordered today: See above Assessment and Plan No orders of the defined types were placed in this encounter.     Disposition:   FU with me in 6 months  Signed, Quintella Reichert, MD  11/12/2015 8:49 AM    [facility] [address] [place], [place], [place]  [address] Phone: [phone]; Fax: [phone]

## 2015-11-12 ENCOUNTER — Ambulatory Visit (INDEPENDENT_AMBULATORY_CARE_PROVIDER_SITE_OTHER): Payer: BLUE CROSS/BLUE SHIELD | Admitting: Cardiology

## 2015-11-12 ENCOUNTER — Encounter: Payer: Self-pay | Admitting: Cardiology

## 2015-11-12 VITALS — BP 106/64 | HR 61 | Ht 67.5 in | Wt 174.2 lb

## 2015-11-12 DIAGNOSIS — I493 Ventricular premature depolarization: Secondary | ICD-10-CM

## 2015-11-12 DIAGNOSIS — E7800 Pure hypercholesterolemia, unspecified: Secondary | ICD-10-CM

## 2015-11-12 DIAGNOSIS — I2510 Atherosclerotic heart disease of native coronary artery without angina pectoris: Secondary | ICD-10-CM | POA: Diagnosis not present

## 2015-11-12 DIAGNOSIS — I471 Supraventricular tachycardia: Secondary | ICD-10-CM | POA: Diagnosis not present

## 2015-11-12 DIAGNOSIS — I491 Atrial premature depolarization: Secondary | ICD-10-CM

## 2015-11-12 DIAGNOSIS — I10 Essential (primary) hypertension: Secondary | ICD-10-CM

## 2015-11-12 DIAGNOSIS — I2583 Coronary atherosclerosis due to lipid rich plaque: Principal | ICD-10-CM

## 2015-11-12 MED ORDER — NEBIVOLOL HCL 2.5 MG PO TABS: 2.5000 mg | ORAL_TABLET | Freq: Every day | ORAL | Status: DC

## 2015-11-12 NOTE — Patient Instructions (Signed)
Medication Instructions:  Your physician has recommended you make the following change in your medication:  1) DECREASE BYSTOLIC to 2.5 mg daily  Labwork: None  Testing/Procedures: None  Follow-Up: Your physician wants you to follow-up in: 6 months with Dr. Mayford Knifeurner. You will receive a reminder letter in the mail two months in advance. If you don't receive a letter, please call our office to schedule the follow-up appointment.   Any Other Special Instructions Will Be Listed Below (If Applicable).     If you need a refill on your cardiac medications before your next appointment, please call your pharmacy.

## 2015-11-25 ENCOUNTER — Encounter: Payer: Self-pay | Admitting: Interventional Cardiology

## 2016-02-11 ENCOUNTER — Other Ambulatory Visit: Payer: Self-pay | Admitting: *Deleted

## 2016-02-11 MED ORDER — NEBIVOLOL HCL 2.5 MG PO TABS: 2.5000 mg | ORAL_TABLET | Freq: Every day | ORAL | Status: DC

## 2016-02-14 ENCOUNTER — Other Ambulatory Visit: Payer: Self-pay | Admitting: Cardiology

## 2016-05-08 ENCOUNTER — Telehealth: Payer: Self-pay

## 2016-05-08 NOTE — Telephone Encounter (Signed)
Prior auth for UnitedHealthBystolic 2.5mg  sent to [facility] [place].

## 2016-05-11 ENCOUNTER — Encounter (INDEPENDENT_AMBULATORY_CARE_PROVIDER_SITE_OTHER): Payer: Self-pay

## 2016-05-11 ENCOUNTER — Ambulatory Visit (INDEPENDENT_AMBULATORY_CARE_PROVIDER_SITE_OTHER): Payer: BLUE CROSS/BLUE SHIELD | Admitting: Cardiology

## 2016-05-11 ENCOUNTER — Encounter: Payer: Self-pay | Admitting: Cardiology

## 2016-05-11 VITALS — BP 118/62 | HR 64 | Ht 67.5 in | Wt 161.0 lb

## 2016-05-11 DIAGNOSIS — I471 Supraventricular tachycardia: Secondary | ICD-10-CM | POA: Diagnosis not present

## 2016-05-11 DIAGNOSIS — E7800 Pure hypercholesterolemia, unspecified: Secondary | ICD-10-CM

## 2016-05-11 DIAGNOSIS — I10 Essential (primary) hypertension: Secondary | ICD-10-CM

## 2016-05-11 DIAGNOSIS — I2510 Atherosclerotic heart disease of native coronary artery without angina pectoris: Secondary | ICD-10-CM | POA: Diagnosis not present

## 2016-05-11 DIAGNOSIS — I493 Ventricular premature depolarization: Secondary | ICD-10-CM

## 2016-05-11 DIAGNOSIS — I2583 Coronary atherosclerosis due to lipid rich plaque: Principal | ICD-10-CM

## 2016-05-11 MED ORDER — NEBIVOLOL HCL 2.5 MG PO TABS
2.5000 mg | ORAL_TABLET | Freq: Every day | ORAL | Status: DC
Start: 2016-05-11 — End: 2016-09-15

## 2016-05-11 MED ORDER — ATORVASTATIN CALCIUM 40 MG PO TABS: 40.0000 mg | ORAL_TABLET | Freq: Every day | ORAL | Status: DC

## 2016-05-11 MED ORDER — NITROGLYCERIN 0.4 MG SL SUBL: 0.4000 mg | SUBLINGUAL_TABLET | SUBLINGUAL | Status: DC | PRN

## 2016-05-11 NOTE — Progress Notes (Signed)
Cardiology Office Note    Date:  05/11/2016   ID:  Maria FailLynda G Peschke, DOB Dec 31, 1955, MRN [id number]  PCP:  Thora LanceEHINGER,ROBERT R, MD  Cardiologist:  Armanda Magicraci Turner, MD   Chief Complaint  Patient presents with  . Coronary Artery Disease  . Hypertension  . Hyperlipidemia    History of Present Illness:  Maria Cochran is a 60 y.o. female with a history of PVC's and nonsustained atrial tachycardia, dyslipidemia and borderline obstructive ASCAD of the RPDA of 75% with no ischemia on nuclear stress test and nonobstructive elsewhere by cath on medical management. She is doing well. She denies any anginal chest pain, SOB, DOE, LE edema, dizziness, or syncope. Occasionally she will notice a skipped heart beat or fluttering for 5 seconds and resolves.      Past Medical History  Diagnosis Date  . Irregular heart beat   . Colonic polyp   . DDD (degenerative disc disease)   . PVC's (premature ventricular contractions)   . PAC (premature atrial contraction)   . Hypertension   . Hypercholesteremia   . Vertigo   . Atrial tachycardia, paroxysmal (HCC)   . Coronary artery disease 2016    50% prox to mid LAD, 50% ramus, 40% ostial D1, 45% distal LCX and 30% prox to mid RCA and 75% RPDA with no ischemia in inferior wall on nuclear stress test - on medical management with no angina.    Past Surgical History  Procedure Laterality Date  . Tonsillectomy    . Cardiac catheterization N/A 08/13/2015    Procedure: Left Heart Cath and Coronary Angiography;  Surgeon: Lyn RecordsHenry W Smith, MD;  [facility];  Service: Cardiovascular;  Laterality: N/A;    Current Medications: Outpatient Prescriptions Prior to Visit  Medication Sig Dispense Refill  . ALPRAZolam (XANAX) 0.5 MG tablet Take 0.125-0.25 mg by mouth at bedtime as needed for anxiety or sleep.    [name]. aspirin 81 MG tablet Take 81 mg by mouth daily.    [name]. atorvastatin (LIPITOR) 40 MG tablet Take 40 mg by mouth daily.    [name]. loratadine (CLARITIN) 10 MG  tablet Take 10 mg by mouth daily as needed for allergies.     [name]. nebivolol (BYSTOLIC) 2.5 MG tablet Take 1 tablet (2.5 mg total) by mouth daily. 30 tablet 8  . nitroGLYCERIN (NITROSTAT) 0.4 MG SL tablet Place 1 tablet (0.4 mg total) under the tongue every 5 (five) minutes as needed for chest pain. 25 tablet 3  . Polyethyl Glycol-Propyl Glycol (SYSTANE OP) Place 2 drops into both eyes as needed (for dry eyes).     No facility-administered medications prior to visit.     Allergies:   Sudafed; Metoprolol; Buprenorphine hcl; Morphine and related; Allegra; Doxycycline monohydrate; Penicillins; Sulfa antibiotics; Sulfacetamide sodium; and Tetracyclines & related   Social History   Social History  . Marital Status: Single    Spouse Name: N/A  . Number of Children: N/A  . Years of Education: N/A   Social History Main Topics  . Smoking status: Never Smoker   . Smokeless tobacco: Never Used  . Alcohol Use: 0.0 oz/week    0 Standard drinks or equivalent per week     Comment: occasional beer and wine  . Drug Use: No  . Sexual Activity: Not Asked   Other Topics Concern  . None   Social History Narrative     Family History:  [name] family history includes CAD in her father; Heart disease in  her father; Hypertension in her father; Parkinsonism in her mother.   ROS:   Please see the history of present illness.    ROS All other systems reviewed and are negative.   PHYSICAL EXAM:   VS:  BP 118/62 mmHg  Pulse 64  Ht 5' 7.5" (1.715 m)  Wt 161 lb (73.029 kg)  BMI 24.83 kg/m2   GEN: Well nourished, well developed, in no acute distress HEENT: normal Neck: no JVD, carotid bruits, or masses Cardiac: RRR; no murmurs, rubs, or gallops,no edema.  Intact distal pulses bilaterally.  Respiratory:  clear to auscultation bilaterally, normal work of breathing GI: soft, nontender, nondistended, + BS MS: no deformity or atrophy Skin: warm and dry, no rash Neuro:  Alert and Oriented x 3,  Strength and sensation are intact Psych: euthymic mood, full affect  Wt Readings from Last 3 Encounters:  05/11/16 161 lb (73.029 kg)  11/12/15 174 lb 3.2 oz (79.017 kg)  08/17/15 180 lb 6.4 oz (81.829 kg)      Studies/Labs Reviewed:   EKG:  EKG is not ordered today.   Recent Labs: 08/12/2015: ALT 12; BUN 12; Creat 0.70; Hemoglobin 15.2*; Platelets 226; Potassium 4.2; Sodium 140   Lipid Panel    Component Value Date/Time   CHOL 184 08/12/2015 1057   TRIG 100 08/12/2015 1057   HDL 68 08/12/2015 1057   CHOLHDL 2.7 08/12/2015 1057   VLDL 20 08/12/2015 1057   LDLCALC 96 08/12/2015 1057    Additional studies/ records that were reviewed today include:  none    ASSESSMENT:    1. Coronary artery disease due to lipid rich plaque   2. Atrial tachycardia, paroxysmal (HCC)   3. PVC's (premature ventricular contractions)   4. Essential hypertension   5. Hypercholesteremia      PLAN:  In order of problems listed above:  1. ASCAD with 75% RPDA and otherwise nonobstructive CAD elsewhere on medical management with no ischemia. Continue ASA/statin/BB.   2. Paroxysmal atrial tachycardia - suppressed on BB.  Continue Bystolic.  3. PVC's - suppressed on BB. 4. HTN - controlled on BB.  Continue Bystolic.   5. Hyperlipidemia - LDL goal < 70.  Continue statin.  Check FLp and ALT.     Medication Adjustments/Labs and Tests Ordered: Current medicines are reviewed at length with the patient today.  Concerns regarding medicines are outlined above.  Medication changes, Labs and Tests ordered today are listed in the Patient Instructions below.  There are no Patient Instructions on file for this visit.   Signed, Armanda Magicraci Turner, MD  05/11/2016 8:55 AM    [facility] [address], [place], [place]  [address] Phone: [phone](336) [phone]; Fax: [phone]) [phone]

## 2016-05-11 NOTE — Patient Instructions (Signed)

## 2016-05-12 ENCOUNTER — Other Ambulatory Visit (INDEPENDENT_AMBULATORY_CARE_PROVIDER_SITE_OTHER): Payer: BLUE CROSS/BLUE SHIELD | Admitting: *Deleted

## 2016-05-12 ENCOUNTER — Telehealth: Payer: Self-pay

## 2016-05-12 DIAGNOSIS — E7800 Pure hypercholesterolemia, unspecified: Secondary | ICD-10-CM

## 2016-05-12 DIAGNOSIS — I2510 Atherosclerotic heart disease of native coronary artery without angina pectoris: Secondary | ICD-10-CM | POA: Diagnosis not present

## 2016-05-12 DIAGNOSIS — I2583 Coronary atherosclerosis due to lipid rich plaque: Principal | ICD-10-CM

## 2016-05-12 LAB — LIPID PANEL
Cholesterol: 135 mg/dL (ref 125–200)
HDL: 76 mg/dL (ref >=46)
LDL Cholesterol: 48 mg/dL (ref <130)
TRIGLYCERIDES: 55 mg/dL (ref <150)
Total CHOL/HDL Ratio: 1.8 Ratio (ref <=5.0)
VLDL: 11 mg/dL (ref <30)

## 2016-05-12 LAB — HEPATIC FUNCTION PANEL
ALBUMIN: 4.3 g/dL (ref 3.6–5.1)
ALT: 15 U/L (ref 6–29)
AST: 20 U/L (ref 10–35)
Alkaline Phosphatase: 73 U/L (ref 33–130)
Bilirubin, Direct: 0.2 mg/dL (ref <=0.2)
Indirect Bilirubin: 0.5 mg/dL (ref 0.2–1.2)
TOTAL PROTEIN: 6.1 g/dL (ref 6.1–8.1)
Total Bilirubin: 0.7 mg/dL (ref 0.2–1.2)

## 2016-05-12 NOTE — Telephone Encounter (Signed)
Prior auth for Bystolic 2.5mg  submitted to [employer].

## 2016-05-17 ENCOUNTER — Telehealth: Payer: Self-pay

## 2016-05-17 NOTE — Telephone Encounter (Signed)
Prior auth for Bystolic 2.5mg  submitted to [employer].

## 2016-05-17 NOTE — Telephone Encounter (Signed)
Bystolic has been denied by [employer]. No reason given. She is intolerant to Metoprolol (severe fatigue) I can appeal, or you could change to something else.

## 2016-05-17 NOTE — Telephone Encounter (Signed)
Please appeal

## 2016-05-19 ENCOUNTER — Telehealth: Payer: Self-pay

## 2016-05-19 NOTE — Telephone Encounter (Signed)
Patient still has a week's worth of Bystolic 2.5. She also has 5mg  she can break in half. Will appeal next week.

## 2016-05-19 NOTE — Telephone Encounter (Signed)
Dofetilide was already reduced to a Tier 2.  They will not lower it any more. I have mailed patient assist form for [employer].

## 2016-05-26 DIAGNOSIS — Z1231 Encounter for screening mammogram for malignant neoplasm of breast: Secondary | ICD-10-CM | POA: Diagnosis not present

## 2016-06-01 ENCOUNTER — Telehealth: Payer: Self-pay

## 2016-06-01 NOTE — Telephone Encounter (Signed)
Denial of Bystolic appealed  To [employer].

## 2016-06-01 NOTE — Telephone Encounter (Signed)
Appeal sent today

## 2016-06-14 ENCOUNTER — Telehealth: Payer: Self-pay

## 2016-06-14 NOTE — Telephone Encounter (Signed)
Patient calls today to report that she [facility] a [facility] from [facility] States it was essentially a denial of our appeal for Bystolic. The letter stated that a PA was never initiated, which is untrue. I called [employer] Appeals [employer]. And was told that the appeal is INVALID. I need to call back and speak with another appeals rep after lunch.

## 2016-07-11 DIAGNOSIS — H6980 Other specified disorders of Eustachian tube, unspecified ear: Secondary | ICD-10-CM | POA: Diagnosis not present

## 2016-07-11 DIAGNOSIS — T148 Other injury of unspecified body region: Secondary | ICD-10-CM | POA: Diagnosis not present

## 2016-07-17 ENCOUNTER — Other Ambulatory Visit: Payer: Self-pay | Admitting: Family Medicine

## 2016-07-17 DIAGNOSIS — R911 Solitary pulmonary nodule: Secondary | ICD-10-CM

## 2016-07-20 ENCOUNTER — Other Ambulatory Visit: Payer: BLUE CROSS/BLUE SHIELD

## 2016-07-25 ENCOUNTER — Ambulatory Visit
Admission: RE | Admit: 2016-07-25 | Discharge: 2016-07-25 | Disposition: A | Discharge status: Home / Self Care | Payer: BLUE CROSS/BLUE SHIELD | Source: Ambulatory Visit | Attending: Family Medicine | Admitting: Family Medicine

## 2016-07-25 DIAGNOSIS — R911 Solitary pulmonary nodule: Secondary | ICD-10-CM | POA: Diagnosis not present

## 2016-08-15 ENCOUNTER — Other Ambulatory Visit: Payer: Self-pay | Admitting: Family Medicine

## 2016-08-15 ENCOUNTER — Other Ambulatory Visit (HOSPITAL_COMMUNITY)
Admission: RE | Admit: 2016-08-15 | Discharge: 2016-08-15 | Disposition: A | Discharge status: Home / Self Care | Payer: BLUE CROSS/BLUE SHIELD | Source: Ambulatory Visit | Attending: Family Medicine | Admitting: Family Medicine

## 2016-08-15 DIAGNOSIS — I493 Ventricular premature depolarization: Secondary | ICD-10-CM | POA: Diagnosis not present

## 2016-08-15 DIAGNOSIS — Z124 Encounter for screening for malignant neoplasm of cervix: Secondary | ICD-10-CM | POA: Diagnosis not present

## 2016-08-15 DIAGNOSIS — E7800 Pure hypercholesterolemia, unspecified: Secondary | ICD-10-CM | POA: Diagnosis not present

## 2016-08-15 DIAGNOSIS — Z01419 Encounter for gynecological examination (general) (routine) without abnormal findings: Secondary | ICD-10-CM | POA: Insufficient documentation

## 2016-08-15 DIAGNOSIS — Z0000 Encounter for general adult medical examination without abnormal findings: Secondary | ICD-10-CM | POA: Diagnosis not present

## 2016-08-17 LAB — CYTOLOGY - PAP

## 2016-09-15 ENCOUNTER — Encounter: Payer: Self-pay | Admitting: Physician Assistant

## 2016-09-15 ENCOUNTER — Ambulatory Visit (INDEPENDENT_AMBULATORY_CARE_PROVIDER_SITE_OTHER): Payer: BLUE CROSS/BLUE SHIELD | Admitting: Physician Assistant

## 2016-09-15 VITALS — BP 122/68 | HR 63 | Ht 67.5 in | Wt 161.2 lb

## 2016-09-15 DIAGNOSIS — R001 Bradycardia, unspecified: Secondary | ICD-10-CM

## 2016-09-15 DIAGNOSIS — R002 Palpitations: Secondary | ICD-10-CM

## 2016-09-15 DIAGNOSIS — I471 Supraventricular tachycardia: Secondary | ICD-10-CM | POA: Diagnosis not present

## 2016-09-15 DIAGNOSIS — I493 Ventricular premature depolarization: Secondary | ICD-10-CM

## 2016-09-15 DIAGNOSIS — I2510 Atherosclerotic heart disease of native coronary artery without angina pectoris: Secondary | ICD-10-CM

## 2016-09-15 DIAGNOSIS — I10 Essential (primary) hypertension: Secondary | ICD-10-CM

## 2016-09-15 DIAGNOSIS — I491 Atrial premature depolarization: Secondary | ICD-10-CM

## 2016-09-15 LAB — TSH: TSH: 3.14 m[IU]/L

## 2016-09-15 LAB — CBC
HCT: 42.7 % (ref 35.0–45.0)
Hemoglobin: 14.2 g/dL (ref 11.7–15.5)
MCH: 30.9 pg (ref 27.0–33.0)
MCHC: 33.3 g/dL (ref 32.0–36.0)
MCV: 92.8 fL (ref 80.0–100.0)
MPV: 10.1 fL (ref 7.5–12.5)
PLATELETS: 225 10*3/uL (ref 140–400)
RBC: 4.6 MIL/uL (ref 3.80–5.10)
RDW: 13.4 % (ref 11.0–15.0)
WBC: 6.7 10*3/uL (ref 3.8–10.8)

## 2016-09-15 MED ORDER — NEBIVOLOL HCL 5 MG PO TABS: 5.0000 mg | ORAL_TABLET | Freq: Every day | ORAL | 1 refills | Status: DC

## 2016-09-15 NOTE — Progress Notes (Addendum)
Cardiology Office Note    Date:  09/15/2016  ID:  Maria Cochran, DOB Jul 09, 1956, MRN [id number] PCP:  Thora LanceEHINGER,ROBERT R, MD  Cardiologist:  Mayford Knifeurner   Chief Complaint: palpitations  History of Present Illness:  Maria Cochran is a 60 y.o. female with history of PVCs, PACs, nonsustained atrial tachycardia, dyslipidemia, borderline obstructive ASCAD (of the RPDA of 75% with no ischemia on nuclear stress test and nonobstructive elsewhere by cath on medical therapy), HTN, vertigo who presents for evaluation of irregular heartbeat. Prior cardiac studies include event monitor 05/2015 with PACs, PVCs, nonsustained atrial tach up to 10 beats. LHC 08/13/15: 50% ramus, 50% prox-mid LAD, 40% D1, 45% Cx, 30% prox-mid RCA, 75% RPDA - nuc 07/13/15 had shown prior MI with peri-infarct ischemia, EF 75%. Labs include LDL 48 & nl LFTS 05/2016, K 4.2, Cr 0.70, Hgb 15.2 in 08/2015.  She presents back to clinic today reporting increase in palpitations. These feel like the same ones that she had in the past, except have increased in frequency. No sustained tachy-palpitations. They only last for a few seconds. Some of the episodes have been tied to lack of sleep or alcohol intake but otherwise in general she cannot pinpoint one specific trigger. She is walking regularly without any angina or SOB. She states her endurance is actually much better than it used to be ever since she started exercising regularly. Her bystolic was cut down in January due to softer BP with instructions to take an extra 1/2 tablet daily if palpitations increased. BP is normal today.  Past Medical History:  Diagnosis Date  . Atrial tachycardia, paroxysmal (HCC)   . Colonic polyp   . Coronary artery disease 2016   50% prox to mid LAD, 50% ramus, 40% ostial D1, 45% distal LCX and 30% prox to mid RCA and 75% RPDA with no ischemia in inferior wall on nuclear stress test - on medical management with no angina.  . DDD (degenerative disc disease)   .  Hypercholesteremia   . Hypertension   . PAC (premature atrial contraction)   . Pulmonary nodule    a. being followed by PCP, stable 07/2016.  [name]. PVC's (premature ventricular contractions)   . Vertigo     Past Surgical History:  Procedure Laterality Date  . CARDIAC CATHETERIZATION N/A 08/13/2015   Procedure: Left Heart Cath and Coronary Angiography;  Surgeon: Lyn RecordsHenry W Smith, MD;  [facility];  Service: Cardiovascular;  Laterality: N/A;  . TONSILLECTOMY      Current Medications: Current Outpatient Prescriptions  Medication Sig Dispense Refill  . ALPRAZolam (XANAX) 0.5 MG tablet Take 0.125-0.25 mg by mouth at bedtime as needed for anxiety or sleep.    [name]. aspirin 81 MG tablet Take 81 mg by mouth daily.    [name]. atorvastatin (LIPITOR) 40 MG tablet Take 1 tablet (40 mg total) by mouth daily. 90 tablet 3  . fluticasone (FLONASE) 50 MCG/ACT nasal spray Place 2 sprays into both nostrils as needed.   11  . loratadine (CLARITIN) 10 MG tablet Take 10 mg by mouth daily as needed for allergies.     [name]. nebivolol (BYSTOLIC) 2.5 MG tablet Take 1 tablet (2.5 mg total) by mouth daily. 90 tablet 3  . nitroGLYCERIN (NITROSTAT) 0.4 MG SL tablet Place 1 tablet (0.4 mg total) under the tongue every 5 (five) minutes as needed for chest pain. 25 tablet 1  . Polyethyl Glycol-Propyl Glycol (SYSTANE OP) Place 2 drops into both eyes as needed (for  dry eyes).     No current facility-administered medications for this visit.      Allergies:   Sudafed [pseudoephedrine hcl]; Metoprolol; Buprenorphine hcl; Morphine and related; Allegra [fexofenadine hcl]; Doxycycline monohydrate; Penicillins; Sulfa antibiotics; Sulfacetamide sodium; and Tetracyclines & related   Social History   Social History  . Marital status: Single    Spouse name: N/A  . Number of children: N/A  . Years of education: N/A   Social History Main Topics  . Smoking status: Never Smoker  . Smokeless tobacco: Never Used  . Alcohol use  0.0 oz/week     Comment: occasional beer and wine  . Drug use: No  . Sexual activity: Not Asked   Other Topics Concern  . None   Social History Narrative  . None     Family History:  [name] family history includes CAD in her father; Heart disease in her father; Hypertension in her father; Parkinsonism in her mother.   ROS:   Please see the history of present illness.  All other systems are reviewed and otherwise negative.    PHYSICAL EXAM:   VS:  BP 122/68 (BP Location: Left Arm, Patient Position: Sitting, Cuff Size: Normal)   Pulse 63   Ht 5' 7.5" (1.715 m)   Wt 161 lb 4 oz (73.1 kg)   BMI 24.88 kg/m   BMI: Body mass index is 24.88 kg/m. GEN: Well nourished, well developed WF in no acute distress  HEENT: normocephalic, atraumatic Neck: no JVD, carotid bruits, or masses Cardiac: RRR; no murmurs, rubs, or gallops, no edema  Respiratory:  clear to auscultation bilaterally, normal work of breathing GI: soft, nontender, nondistended, + BS MS: no deformity or atrophy  Skin: warm and dry, no rash Neuro:  Alert and Oriented x 3, Strength and sensation are intact, follows commands Psych: euthymic mood, full affect  Wt Readings from Last 3 Encounters:  09/15/16 161 lb 4 oz (73.1 kg)  05/11/16 161 lb (73 kg)  11/12/15 174 lb 3.2 oz (79 kg)      Studies/Labs Reviewed:   EKG:  EKG was ordered today and personally reviewed by me and demonstrates sinus bradycardia 59bpm, possibel left atrial enlargement, no acute changes.  Recent Labs: 05/12/2016: ALT 15   Lipid Panel    Component Value Date/Time   CHOL 135 05/12/2016 0818   TRIG 55 05/12/2016 0818   HDL 76 05/12/2016 0818   CHOLHDL 1.8 05/12/2016 0818   VLDL 11 05/12/2016 0818   LDLCALC 48 05/12/2016 0818    Additional studies/ records that were reviewed today include: Summarized above.    ASSESSMENT & PLAN:   1. Palpitations - she has known history of ectopy as below. No new or striking features. Will  check basic labs including TSH, lytes, blood count. Will obtain a baseline echocardiogram to ensure LV function is still normal. Will increase Bystolic back to 5mg  daily. If no improvement would consider switching to propranolol LA 60mg  daily. She was previously intolerant to metoprolol. A switch to diltiazem could be considered, although with her concomitant CAD, would prefer to stick with the beta blocker therapy first. She's worn 2 monitors in the past to evaluate this - we discussed mobile [employer] monitoring as an option to follow her palpitations.  2. H/o PVCs/PACs/atrial tach - as above. 3. CAD - no recent anginal symptoms. Continue ASA, BB, statin. 4. Essential HTN - normal blood pressure today. Follow with med changes. 5. Sinus bradycardia - cannot be too aggressive with  pushing up beta blocker therapy because of baseline HR tending to run right around 60. Monitor with med changes.  Disposition: F/u with me in 4 weeks.   Medication Adjustments/Labs and Tests Ordered: Current medicines are reviewed at length with the patient today.  Concerns regarding medicines are outlined above. Medication changes, Labs and Tests ordered today are summarized above and listed in the Patient Instructions accessible in Encounters.   Thomasene MohairSigned, Dayna Dunn PA-C  09/15/2016 3:55 PM    [facility] [address], [place], [place]  [address] Phone: [phone](336) [phone]; Fax: [phone]

## 2016-09-15 NOTE — Patient Instructions (Signed)
Medication Instructions:  Your physician has recommended you make the following change in your medication:  1.  INCREASE the Bystolic to 5 mg taking 1 tablet daily   Labwork: TODAY:  BMET, MAGNESIUM, CBC, & TSH  Testing/Procedures: Your physician has requested that you have an echocardiogram. Echocardiography is a painless test that uses sound waves to create images of your heart. It provides your doctor with information about the size and shape of your heart and how well your heart's chambers and valves are working. This procedure takes approximately one hour. There are no restrictions for this procedure.    Follow-Up: Your physician recommends that you schedule a follow-up appointment in: AT LEAST 2 DAYS AFTER ECHO IS COMPLETE WITH DAYNA DUNN, PA-C   Any Other Special Instructions Will Be Listed Below (If Applicable).  ALIVECOR IS THE NAME OF THE SMART PHONE DEVICE YOU CAN GET TO RECORD THE PALPITATIONS  Echocardiogram An echocardiogram, or echocardiography, uses sound waves (ultrasound) to produce an image of your heart. The echocardiogram is simple, painless, obtained within a short period of time, and offers valuable information to your health care provider. The images from an echocardiogram can provide information such as:  Evidence of coronary artery disease (CAD).  Heart size.  Heart muscle function.  Heart valve function.  Aneurysm detection.  Evidence of a past heart attack.  Fluid buildup around the heart.  Heart muscle thickening.  Assess heart valve function. LET [facility] KNOW ABOUT:  Any allergies you have.  All medicines you are taking, including vitamins, herbs, eye drops, creams, and over-the-counter medicines.  Previous problems you or members of your family have had with the use of anesthetics.  Any blood disorders you have.  Previous surgeries you have had.  Medical conditions you have.  Possibility of pregnancy, if this  applies. BEFORE THE PROCEDURE  No special preparation is needed. Eat and drink normally.  PROCEDURE   In order to produce an image of your heart, gel will be applied to your chest and a wand-like tool (transducer) will be moved over your chest. The gel will help transmit the sound waves from the transducer. The sound waves will harmlessly bounce off your heart to allow the heart images to be captured in real-time motion. These images will then be recorded.  You may need an IV to receive a medicine that improves the quality of the pictures. AFTER THE PROCEDURE You may return to your normal schedule including diet, activities, and medicines, unless your health care provider tells you otherwise.   This information is not intended to replace advice given to you by your health care provider. Make sure you discuss any questions you have with your health care provider.   Document Released: 10/20/2000 Document Revised: 11/13/2014 Document Reviewed: 06/30/2013 Elsevier Interactive Patient Education [employer]2016 Elsevier Inc.   If you need a refill on your cardiac medications before your next appointment, please call your pharmacy.

## 2016-09-16 LAB — BASIC METABOLIC PANEL
BUN: 16 mg/dL (ref 7–25)
CALCIUM: 9.6 mg/dL (ref 8.6–10.4)
CO2: 25 mmol/L (ref 20–31)
Chloride: 103 mmol/L (ref 98–110)
Creat: 0.92 mg/dL (ref 0.50–1.05)
GLUCOSE: 82 mg/dL (ref 65–99)
Potassium: 4.3 mmol/L (ref 3.5–5.3)
Sodium: 140 mmol/L (ref 135–146)

## 2016-09-16 LAB — MAGNESIUM: Magnesium: 2.2 mg/dL (ref 1.5–2.5)

## 2016-09-19 NOTE — Progress Notes (Signed)
Cardiology Office Note    Date:  09/21/2016  ID:  Maria Cochran, DOB 01-Mar-1956, MRN [id number] PCP:  Thora LanceEHINGER,ROBERT R, MD  Cardiologist:  Mayford Knifeurner   Chief Complaint: f/u palpitations  History of Present Illness:  Maria Cochran is a 60 y.o. female with history of PVCs, PACs, nonsustained atrial tachycardia, sinus bradycardia (baseline HR 60s), dyslipidemia, borderline obstructive ASCAD (of the RPDA of 75% with no ischemia on nuclear stress test and nonobstructive elsewhere by cath on medical therapy), HTN, vertigo who presents for evaluation of irregular heartbeat. Prior cardiac studies include event monitor 05/2015 with PACs, PVCs, nonsustained atrial tach up to 10 beats. LHC 08/13/15: 50% ramus, 50% prox-mid LAD, 40% D1, 45% Cx, 30% prox-mid RCA, 75% RPDA - nuc 07/13/15 had shown prior MI with peri-infarct ischemia, EF 75%. Labs include LDL 48 & nl LFTS 05/2016. I saw her in clinic 09/15/16 at which time she was reporting increased palpitations. The plan was to increase Bystolic to 5mg  daily. She was previously intolerant of metoprolol. Screening labs were all normal on 09/15/16 including TSH, Mg, K. 2D echo 09/20/16 showed EF 60-655, no RWMA, grade 2 DD, mildly calcified AV with mild AI, mild MR, mild LAE.  She presents back for follow-up. She actually noticed after her office visit that her palpitations subsided. She didn't get a chance to go to the pharmacy so she stayed at 2.5mg  daily through the weekend and things actually remained calm. We reviewed 2D echo results. She does mention that very rarely when she's walking the dog she gets a pain in the back of her shoulder and a pain in the front of her thigh. This is very infrequent and does not happen every time she exerts herself. No overt CP. No dyspnea. She walks 6930min-1 hour daily.  Past Medical History:  Diagnosis Date  . Atrial tachycardia, paroxysmal (HCC)   . Colonic polyp   . Coronary artery disease 2016   50% prox to mid LAD, 50% ramus, 40%  ostial D1, 45% distal LCX and 30% prox to mid RCA and 75% RPDA with no ischemia in inferior wall on nuclear stress test - on medical management with no angina.  . DDD (degenerative disc disease)   . Hypercholesteremia   . Hypertension   . PAC (premature atrial contraction)   . Pulmonary nodule    a. being followed by PCP, stable 07/2016.  [name]. PVC's (premature ventricular contractions)   . Sinus bradycardia   . Vertigo     Past Surgical History:  Procedure Laterality Date  . CARDIAC CATHETERIZATION N/A 08/13/2015   Procedure: Left Heart Cath and Coronary Angiography;  Surgeon: Lyn RecordsHenry W Smith, MD;  [facility];  Service: Cardiovascular;  Laterality: N/A;  . TONSILLECTOMY      Current Medications: Current Outpatient Prescriptions  Medication Sig Dispense Refill  . ALPRAZolam (XANAX) 0.5 MG tablet Take 0.125-0.25 mg by mouth at bedtime as needed for anxiety or sleep.    [name]. aspirin 81 MG tablet Take 81 mg by mouth daily.    [name]. atorvastatin (LIPITOR) 40 MG tablet Take 1 tablet (40 mg total) by mouth daily. 90 tablet 3  . fluticasone (FLONASE) 50 MCG/ACT nasal spray Place 2 sprays into both nostrils as needed.   11  . loratadine (CLARITIN) 10 MG tablet Take 10 mg by mouth daily as needed for allergies.     [name]. nebivolol (BYSTOLIC) 5 MG tablet Take 1 tablet (5 mg total) by mouth daily. 90 tablet  1  . nitroGLYCERIN (NITROSTAT) 0.4 MG SL tablet Place 1 tablet (0.4 mg total) under the tongue every 5 (five) minutes as needed for chest pain. 25 tablet 1  . Polyethyl Glycol-Propyl Glycol (SYSTANE OP) Place 2 drops into both eyes as needed (for dry eyes).     No current facility-administered medications for this visit.      Allergies:   Sudafed [pseudoephedrine hcl]; Metoprolol; Buprenorphine hcl; Morphine and related; Allegra [fexofenadine hcl]; Doxycycline monohydrate; Penicillins; Sulfa antibiotics; Sulfacetamide sodium; and Tetracyclines & related   Social History   Social History    . Marital status: Single    Spouse name: N/A  . Number of children: N/A  . Years of education: N/A   Social History Main Topics  . Smoking status: Never Smoker  . Smokeless tobacco: Never Used  . Alcohol use 0.0 oz/week     Comment: occasional beer and wine  . Drug use: No  . Sexual activity: Not Asked   Other Topics Concern  . None   Social History Narrative  . None     Family History:  [name] family history includes CAD in her father; Heart disease in her father; Hypertension in her father; Parkinsonism in her mother.   ROS:   Please see the history of present illness.  All other systems are reviewed and otherwise negative.    PHYSICAL EXAM:   VS:  BP 122/70   Pulse 74   Ht 5\' 7"  (1.702 m)   Wt 161 lb 12.8 oz (73.4 kg)   BMI 25.34 kg/m   BMI: Body mass index is 25.34 kg/m. GEN: Well nourished, well developed WF in no acute distress  HEENT: normocephalic, atraumatic Neck: no JVD, carotid bruits, or masses Cardiac: RRR; no murmurs, rubs, or gallops, no edema  Respiratory:  clear to auscultation bilaterally, normal work of breathing GI: soft, nontender, nondistended, + BS MS: no deformity or atrophy  Skin: warm and dry, no rash Neuro:  Alert and Oriented x 3, Strength and sensation are intact, follows commands Psych: euthymic mood, full affect  Wt Readings from Last 3 Encounters:  09/21/16 161 lb 12.8 oz (73.4 kg)  09/15/16 161 lb 4 oz (73.1 kg)  05/11/16 161 lb (73 kg)      Studies/Labs Reviewed:   EKG:  EKG was not ordered today.  Recent Labs: 05/12/2016: ALT 15 09/15/2016: BUN 16; Creat 0.92; Hemoglobin 14.2; Magnesium 2.2; Platelets 225; Potassium 4.3; Sodium 140; TSH 3.14   Lipid Panel    Component Value Date/Time   CHOL 135 05/12/2016 0818   TRIG 55 05/12/2016 0818   HDL 76 05/12/2016 0818   CHOLHDL 1.8 05/12/2016 0818   VLDL 11 05/12/2016 0818   LDLCALC 48 05/12/2016 0818    Additional studies/ records that were reviewed today  include: Summarized above.    ASSESSMENT & PLAN:   1. Palpitations - recent waxing/waning without any new symptoms. She has not yet increased Bystolic to 5mg  daily --- she noticed symptoms improved over the weekend on their own with continuing her prior 2.5mg  dose. Will continue Bystolic 2.5mg  daily and have her take an extra 1 tablet daily as needed for days when she is experiencing more palpitations. Again, reviewed [employer] as a possible option for home monitoring. If the character or frequency of her palpitations change, would consider repeat Holter monitoring. 2. H/o PVCs/PACs/atrial tach - as above. 3. CAD - no recent anginal symptoms. Continue ASA, BB, statin. She does describe occassional shoulder and frontal  thigh pain when she walks the dog but she states this is extremely rare. She does have MSK issues and wonders if it is related. I advised she continue to remain active as she is doing walking 30 min to an hour per day. If symptoms become regularly reproducible with exertion, I asked her to contact us immediately. 4. Essential HTN - controlled. 5. Sinus bradycardia - cannot be too aggressive with AVN blocking therapy. Stable. Monitor.  Disposition: F/u with Dr. Mayford Knifeurner in January as scheduled.   Medication Adjustments/Labs and Tests Ordered: Current medicines are reviewed at length with the patient today.  Concerns regarding medicines are outlined above. Medication changes, Labs and Tests ordered today are summarized above and listed in the Patient Instructions accessible in Encounters.   Thomasene MohairSigned, Graylyn Bunney PA-C  09/21/2016 9:18 AM    [facility] [address], [place], [place]  [address] Phone: [phone](336) [phone]; Fax: [phone]) [phone]

## 2016-09-20 ENCOUNTER — Other Ambulatory Visit: Payer: Self-pay

## 2016-09-20 ENCOUNTER — Encounter (INDEPENDENT_AMBULATORY_CARE_PROVIDER_SITE_OTHER): Payer: Self-pay

## 2016-09-20 ENCOUNTER — Ambulatory Visit (HOSPITAL_COMMUNITY): Payer: BLUE CROSS/BLUE SHIELD | Attending: Cardiology

## 2016-09-20 DIAGNOSIS — R002 Palpitations: Secondary | ICD-10-CM | POA: Insufficient documentation

## 2016-09-20 DIAGNOSIS — I080 Rheumatic disorders of both mitral and aortic valves: Secondary | ICD-10-CM | POA: Insufficient documentation

## 2016-09-20 DIAGNOSIS — I5030 Unspecified diastolic (congestive) heart failure: Secondary | ICD-10-CM | POA: Insufficient documentation

## 2016-09-20 LAB — ECHOCARDIOGRAM COMPLETE
AOASC: 32 cm
AVLVOTPG: 3 mmHg
CHL CUP LV S' LATERAL: 10.8 cm/s
EERAT: 8.86
EWDT: 218 ms
FS: 41 % (ref 28–44)
IV/PV OW: 1.23
LA diam end sys: 42 mm
LADIAMINDEX: 2.26 cm/m2
LASIZE: 42 mm
LAVOLA4C: 30 mL
LV E/e' medial: 8.86
LV E/e'average: 8.86
LV PW d: 9.54 mm — AB (ref 0.6–1.1)
LV SIMPSON'S DISK: 68
LV TDI E'LATERAL: 8.58
LV TDI E'MEDIAL: 7.21
LV dias vol index: 35 mL/m2
LV dias vol: 65 mL (ref 46–106)
LV sys vol index: 11 mL/m2
LVELAT: 8.58 cm/s
LVOT SV: 64 mL
LVOT VTI: 22.4 cm
LVOT area: 2.84 cm2
LVOT peak vel: 90.2 cm/s
LVOTD: 19 mm
LVSYSVOL: 21 mL (ref 14–42)
MV Dec: 218
MV Peak grad: 2 mmHg
MVPKAVEL: 49.4 m/s
MVPKEVEL: 76 m/s
Reg peak vel: 243 cm/s
Stroke v: 44 ml
TRMAXVEL: 243 cm/s

## 2016-09-21 ENCOUNTER — Ambulatory Visit (INDEPENDENT_AMBULATORY_CARE_PROVIDER_SITE_OTHER): Payer: BLUE CROSS/BLUE SHIELD | Admitting: Physician Assistant

## 2016-09-21 ENCOUNTER — Encounter: Payer: Self-pay | Admitting: Physician Assistant

## 2016-09-21 VITALS — BP 122/70 | HR 74 | Ht 67.0 in | Wt 161.8 lb

## 2016-09-21 DIAGNOSIS — I4719 Other supraventricular tachycardia: Secondary | ICD-10-CM

## 2016-09-21 DIAGNOSIS — R002 Palpitations: Secondary | ICD-10-CM | POA: Diagnosis not present

## 2016-09-21 DIAGNOSIS — I491 Atrial premature depolarization: Secondary | ICD-10-CM

## 2016-09-21 DIAGNOSIS — I493 Ventricular premature depolarization: Secondary | ICD-10-CM

## 2016-09-21 DIAGNOSIS — I471 Supraventricular tachycardia: Secondary | ICD-10-CM | POA: Diagnosis not present

## 2016-09-21 DIAGNOSIS — R001 Bradycardia, unspecified: Secondary | ICD-10-CM

## 2016-09-21 DIAGNOSIS — I2510 Atherosclerotic heart disease of native coronary artery without angina pectoris: Secondary | ICD-10-CM

## 2016-09-21 MED ORDER — NEBIVOLOL HCL 2.5 MG PO TABS: ORAL_TABLET | ORAL | 1 refills | Status: DC

## 2016-09-21 NOTE — Patient Instructions (Addendum)
Medication Instructions:  Your physician has recommended you make the following change in your medication:  1.  DECREASE the Nebivolol (Bystolic) to 2.5 mg taking 1 tablet daily and 1 extra tablet as needed daily for palpitations   Labwork: None ordered  Testing/Procedures: None ordered  Follow-Up: Your physician recommends that you schedule a follow-up appointment in: KEEP YOUR FOLLOW- UP APPOINTMETN  Any Other Special Instructions Will Be Listed Below (If Applicable).     If you need a refill on your cardiac medications before your next appointment, please call your pharmacy.

## 2016-10-25 ENCOUNTER — Encounter: Payer: Self-pay | Admitting: *Deleted

## 2016-10-29 DIAGNOSIS — J019 Acute sinusitis, unspecified: Secondary | ICD-10-CM | POA: Diagnosis not present

## 2016-11-09 DIAGNOSIS — H2512 Age-related nuclear cataract, left eye: Secondary | ICD-10-CM | POA: Diagnosis not present

## 2016-11-15 ENCOUNTER — Encounter (INDEPENDENT_AMBULATORY_CARE_PROVIDER_SITE_OTHER): Payer: Self-pay

## 2016-11-15 ENCOUNTER — Encounter: Payer: Self-pay | Admitting: Cardiology

## 2016-11-15 ENCOUNTER — Ambulatory Visit (INDEPENDENT_AMBULATORY_CARE_PROVIDER_SITE_OTHER): Payer: BLUE CROSS/BLUE SHIELD | Admitting: Cardiology

## 2016-11-15 VITALS — BP 126/68 | HR 71 | Ht 67.0 in | Wt 164.4 lb

## 2016-11-15 DIAGNOSIS — I2510 Atherosclerotic heart disease of native coronary artery without angina pectoris: Secondary | ICD-10-CM | POA: Diagnosis not present

## 2016-11-15 DIAGNOSIS — I493 Ventricular premature depolarization: Secondary | ICD-10-CM

## 2016-11-15 DIAGNOSIS — I471 Supraventricular tachycardia: Secondary | ICD-10-CM | POA: Diagnosis not present

## 2016-11-15 DIAGNOSIS — E7800 Pure hypercholesterolemia, unspecified: Secondary | ICD-10-CM

## 2016-11-15 DIAGNOSIS — I10 Essential (primary) hypertension: Secondary | ICD-10-CM

## 2016-11-15 DIAGNOSIS — R001 Bradycardia, unspecified: Secondary | ICD-10-CM

## 2016-11-15 NOTE — Progress Notes (Signed)
Cardiology Office Note    Date:  11/15/2016   ID:  Maria Cochran, DOB 03-03-1956, MRN [id number]  PCP:  Thora Lance, MD  Cardiologist:  Armanda Magic, MD   Chief Complaint  Patient presents with  . Coronary Artery Disease  . Hypertension  . Hyperlipidemia    History of Present Illness:  Maria Cochran is a 61 y.o. female with a history of PVC's and nonsustained atrial tachycardia, dyslipidemia and borderline obstructive ASCAD of the RPDA of 75% with no ischemia on nuclear stress test and nonobstructive elsewhere by cath on medical management. She is doing well. She denies any anginal chest pain, SOB, DOE, LE edema, dizziness, or syncope. Once in a while she will have some very mild pressure in her chest that she thinks is due to stress. It is not exertional and tends to come on with stress.  Occasionally she will notice a skipped heart beat or fluttering for 5 seconds and resolves.  She did have one prolonged episode in the fall and saw my extender and her palpitations have since settled down.    Past Medical History:  Diagnosis Date  . Atrial tachycardia, paroxysmal (HCC)   . Colonic polyp   . Coronary artery disease 2016   50% prox to mid LAD, 50% ramus, 40% ostial D1, 45% distal LCX and 30% prox to mid RCA and 75% RPDA with no ischemia in inferior wall on nuclear stress test - on medical management with no angina.  . DDD (degenerative disc disease)   . Hypercholesteremia   . Hypertension   . PAC (premature atrial contraction)   . Pulmonary nodule    a. being followed by PCP, stable 07/2016.  [name] PVC's (premature ventricular contractions)   . Sinus bradycardia   . Vertigo     Past Surgical History:  Procedure Laterality Date  . CARDIAC CATHETERIZATION N/A 08/13/2015   Procedure: Left Heart Cath and Coronary Angiography;  Surgeon: Lyn Records, MD;  Location: [facility] INVASIVE CV LAB;  Service: Cardiovascular;  Laterality: N/A;  . TONSILLECTOMY      Current  Medications: Outpatient Medications Prior to Visit  Medication Sig Dispense Refill  . ALPRAZolam (XANAX) 0.5 MG tablet Take 0.125-0.25 mg by mouth at bedtime as needed for anxiety or sleep.    [name] aspirin 81 MG tablet Take 81 mg by mouth daily.    [name] atorvastatin (LIPITOR) 40 MG tablet Take 1 tablet (40 mg total) by mouth daily. 90 tablet 3  . fluticasone (FLONASE) 50 MCG/ACT nasal spray Place 2 sprays into both nostrils as needed.   11  . loratadine (CLARITIN) 10 MG tablet Take 10 mg by mouth daily as needed for allergies.     [name] nebivolol (BYSTOLIC) 2.5 MG tablet Take 1 tablet by mouth daily and may take 1 extra tablet as needed for palpitations daily 180 tablet 1  . nitroGLYCERIN (NITROSTAT) 0.4 MG SL tablet Place 1 tablet (0.4 mg total) under the tongue every 5 (five) minutes as needed for chest pain. 25 tablet 1  . Polyethyl Glycol-Propyl Glycol (SYSTANE OP) Place 2 drops into both eyes as needed (for dry eyes).     No facility-administered medications prior to visit.      Allergies:   Sudafed [pseudoephedrine hcl]; Metoprolol; Buprenorphine hcl; Morphine and related; Allegra [fexofenadine hcl]; Doxycycline monohydrate; Penicillins; Sulfa antibiotics; Sulfacetamide sodium; and Tetracyclines & related   Social History   Social History  . Marital status: Single    Spouse name: N/A  .  Number of children: N/A  . Years of education: N/A   Social History Main Topics  . Smoking status: Never Smoker  . Smokeless tobacco: Never Used  . Alcohol use 0.0 oz/week     Comment: occasional beer and wine  . Drug use: No  . Sexual activity: Not Asked   Other Topics Concern  . None   Social History Narrative  . None     Family History:  [name] family history includes CAD in her father; Heart disease in her father; Hypertension in her father; Parkinsonism in her mother.   ROS:   Please see the history of present illness.    ROS All other systems reviewed and are negative.  No  flowsheet data found.     PHYSICAL EXAM:   VS:  BP 126/68   Pulse 71   Ht 5\' 7"  (1.702 m)   Wt 164 lb 6.4 oz (74.6 kg)   SpO2 98%   BMI 25.75 kg/m    GEN: Well nourished, well developed, in no acute distress  HEENT: normal  Neck: no JVD, carotid bruits, or masses Cardiac: RRR; no murmurs, rubs, or gallops,no edema.  Intact distal pulses bilaterally.  Respiratory:  clear to auscultation bilaterally, normal work of breathing GI: soft, nontender, nondistended, + BS MS: no deformity or atrophy  Skin: warm and dry, no rash Neuro:  Alert and Oriented x 3, Strength and sensation are intact Psych: euthymic mood, full affect  Wt Readings from Last 3 Encounters:  11/15/16 164 lb 6.4 oz (74.6 kg)  09/21/16 161 lb 12.8 oz (73.4 kg)  09/15/16 161 lb 4 oz (73.1 kg)      Studies/Labs Reviewed:   EKG:  EKG is not ordered today.    Recent Labs: 05/12/2016: ALT 15 09/15/2016: BUN 16; Creat 0.92; Hemoglobin 14.2; Magnesium 2.2; Platelets 225; Potassium 4.3; Sodium 140; TSH 3.14   Lipid Panel    Component Value Date/Time   CHOL 135 05/12/2016 0818   TRIG 55 05/12/2016 0818   HDL 76 05/12/2016 0818   CHOLHDL 1.8 05/12/2016 0818   VLDL 11 05/12/2016 0818   LDLCALC 48 05/12/2016 0818    Additional studies/ records that were reviewed today include:  none    ASSESSMENT:    1. Coronary artery disease involving native coronary artery of native heart without angina pectoris   2. Essential hypertension   3. Atrial tachycardia, paroxysmal (HCC)   4. PVC's (premature ventricular contractions)   5. Sinus bradycardia   6. Hypercholesteremia      PLAN:  In order of problems listed above:  1. ASCAD - cath showed 50% prox to mid LAD, 50% ramus, 40% ostial D1, 45% distal LCX and 30% prox to mid RCA and 75% RPDA with no ischemia in inferior wall on nuclear stress test - on medical management with no angina but has had some episodes of pressure related to stress.  She tries to get an hour  of aerobic activity daily and has no symptoms during that period. I have instructed her to call immediately if she develops any exertional chest discomfort.   Continue ASA, statin and BB 2. HTN - BP controlled on current meds.  Continue BB. 3. Paroxysmal atrial tachycardia - controlled on BB.  Continue Bystolic. She knows that she can take an extra bystolic if her palpitations increase in frequency.  4. PVC's - suppressed on BB. 5. Sinus bradycardia 6. Hyperlipidemia - LDL goal < 70.  Continue statin.  LDL at goal  in July (48).     Medication Adjustments/Labs and Tests Ordered: Current medicines are reviewed at length with the patient today.  Concerns regarding medicines are outlined above.  Medication changes, Labs and Tests ordered today are listed in the Patient Instructions below.  There are no Patient Instructions on file for this visit.   Signed, Armanda Magicraci Mikalyn Hermida, MD  11/15/2016 8:57 AM    [facility] [address], [place], [place]  [address] Phone: [phone]; Fax: [phone]) [phone]

## 2016-11-15 NOTE — Patient Instructions (Signed)
Medication Instructions:  Your physician recommends that you continue on your current medications as directed. Please refer to the Current Medication list given to you today.   Labwork: Please return for FASTING labs.  Testing/Procedures: None  Follow-Up: Your physician wants you to follow-up in: 6 months with Dr. Turner. You will receive a reminder letter in the mail two months in advance. If you don't receive a letter, please call our office to schedule the follow-up appointment.   Any Other Special Instructions Will Be Listed Below (If Applicable).     If you need a refill on your cardiac medications before your next appointment, please call your pharmacy.   

## 2016-11-21 ENCOUNTER — Other Ambulatory Visit: Payer: BLUE CROSS/BLUE SHIELD

## 2016-12-19 DIAGNOSIS — J069 Acute upper respiratory infection, unspecified: Secondary | ICD-10-CM | POA: Diagnosis not present

## 2017-01-29 DIAGNOSIS — R21 Rash and other nonspecific skin eruption: Secondary | ICD-10-CM | POA: Diagnosis not present

## 2017-01-29 DIAGNOSIS — R42 Dizziness and giddiness: Secondary | ICD-10-CM | POA: Diagnosis not present

## 2017-01-29 DIAGNOSIS — L609 Nail disorder, unspecified: Secondary | ICD-10-CM | POA: Diagnosis not present

## 2017-01-30 ENCOUNTER — Encounter (INDEPENDENT_AMBULATORY_CARE_PROVIDER_SITE_OTHER): Payer: Self-pay

## 2017-01-30 ENCOUNTER — Telehealth (HOSPITAL_COMMUNITY): Payer: Self-pay | Admitting: *Deleted

## 2017-01-30 ENCOUNTER — Ambulatory Visit (INDEPENDENT_AMBULATORY_CARE_PROVIDER_SITE_OTHER): Payer: BLUE CROSS/BLUE SHIELD | Admitting: Cardiology

## 2017-01-30 VITALS — BP 126/72 | HR 62 | Ht 67.0 in | Wt 163.5 lb

## 2017-01-30 DIAGNOSIS — I10 Essential (primary) hypertension: Secondary | ICD-10-CM | POA: Diagnosis not present

## 2017-01-30 DIAGNOSIS — I4719 Other supraventricular tachycardia: Secondary | ICD-10-CM

## 2017-01-30 DIAGNOSIS — I2510 Atherosclerotic heart disease of native coronary artery without angina pectoris: Secondary | ICD-10-CM | POA: Diagnosis not present

## 2017-01-30 DIAGNOSIS — E7800 Pure hypercholesterolemia, unspecified: Secondary | ICD-10-CM

## 2017-01-30 DIAGNOSIS — I471 Supraventricular tachycardia: Secondary | ICD-10-CM | POA: Diagnosis not present

## 2017-01-30 DIAGNOSIS — R079 Chest pain, unspecified: Secondary | ICD-10-CM

## 2017-01-30 DIAGNOSIS — R001 Bradycardia, unspecified: Secondary | ICD-10-CM

## 2017-01-30 DIAGNOSIS — I493 Ventricular premature depolarization: Secondary | ICD-10-CM | POA: Diagnosis not present

## 2017-01-30 LAB — LIPID PANEL
CHOL/HDL RATIO: 2.1 ratio (ref 0.0–4.4)
CHOLESTEROL TOTAL: 180 mg/dL (ref 100–199)
HDL: 87 mg/dL (ref >39)
LDL CALC: 78 mg/dL (ref 0–99)
Triglycerides: 74 mg/dL (ref 0–149)
VLDL Cholesterol Cal: 15 mg/dL (ref 5–40)

## 2017-01-30 LAB — HEPATIC FUNCTION PANEL
ALBUMIN: 4.7 g/dL (ref 3.6–4.8)
ALK PHOS: 87 IU/L (ref 39–117)
ALT: 25 IU/L (ref 0–32)
AST: 30 IU/L (ref 0–40)
Bilirubin Total: 0.8 mg/dL (ref 0.0–1.2)
Bilirubin, Direct: 0.21 mg/dL (ref 0.00–0.40)
TOTAL PROTEIN: 6.8 g/dL (ref 6.0–8.5)

## 2017-01-30 NOTE — Telephone Encounter (Signed)
Patient given detailed instructions per Myocardial Perfusion Study Information Sheet for the test on 02/01/17 at 7:15. Patient notified to arrive 15 minutes early and that it is imperative to arrive on time for appointment to keep from having the test rescheduled. ° If you need to cancel or reschedule your appointment, please call the office within 24 hours of your appointment. Failure to do so may result in a cancellation of your appointment, and a $50 no show fee. Patient verbalized understanding.Sharon S Brooks ° ° ° °

## 2017-01-30 NOTE — Progress Notes (Signed)
Cardiology Office Note    Date:  01/30/2017   ID:  Maria Cochran, DOB September 18, 1956, MRN [id number]  PCP:  Maria Cochran,Maria R, MD  Cardiologist:  Maria Cochran Asjia Berrios, MD   Chief Complaint  Patient presents with  . Coronary Artery Disease  . Hypertension  . Hyperlipidemia    History of Present Illness:  Maria Cochran is a 61 y.o. female with a history of PVC's and nonsustained atrial tachycardia, dyslipidemia and borderline obstructive ASCAD of the RPDA of 75% with no ischemia on nuclear stress test and nonobstructive elsewhere by cath on medical management. She comes in today for followup and is doing well. She denies any anginal chest pain, SOB, DOE, LE edema, dizziness (except for vertigo), or syncope. She says that she had a "rhythmic pain" while in bed last night that she described as a pulsing sensation when she got up to use the bathroom. This only lasted a few seconds and resolved after moving around.  She walks at least 1 hour daily without any problems.     Past Medical History:  Diagnosis Date  . Atrial tachycardia, paroxysmal (HCC)   . Colonic polyp   . Coronary artery disease 2016   50% prox to mid LAD, 50% ramus, 40% ostial D1, 45% distal LCX and 30% prox to mid RCA and 75% RPDA with no ischemia in inferior wall on nuclear stress test - on medical management with no angina.  . DDD (degenerative disc disease)   . Hypercholesteremia   . Hypertension   . PAC (premature atrial contraction)   . Pulmonary nodule    a. being followed by PCP, stable 07/2016.  Maria Cochran. PVC's (premature ventricular contractions)   . Sinus bradycardia   . Vertigo     Past Surgical History:  Procedure Laterality Date  . CARDIAC CATHETERIZATION N/A 08/13/2015   Procedure: Left Heart Cath and Coronary Angiography;  Surgeon: Lyn RecordsHenry W Smith, MD;  [facility];  Service: Cardiovascular;  Laterality: N/A;  . TONSILLECTOMY      Current Medications: Current Meds  Medication Sig  . ALPRAZolam (XANAX)  0.5 MG tablet Take 0.125-0.25 mg by mouth at bedtime as needed for anxiety or sleep.  Maria Cochran. aspirin 81 MG tablet Take 81 mg by mouth daily.  Maria Cochran. atorvastatin (LIPITOR) 40 MG tablet Take 1 tablet (40 mg total) by mouth daily.  . fluticasone (FLONASE) 50 MCG/ACT nasal spray Place 2 sprays into both nostrils as needed.   . loratadine (CLARITIN) 10 MG tablet Take 10 mg by mouth daily as needed for allergies.   Maria Cochran. nebivolol (BYSTOLIC) 2.5 MG tablet Take 1 tablet by mouth daily and may take 1 extra tablet as needed for palpitations daily  . nitroGLYCERIN (NITROSTAT) 0.4 MG SL tablet Place 1 tablet (0.4 mg total) under the tongue every 5 (five) minutes as needed for chest pain.  Maria Cochran. Polyethyl Glycol-Propyl Glycol (SYSTANE OP) Place 2 drops into both eyes as needed (for dry eyes).    Allergies:   Sudafed [pseudoephedrine hcl]; Metoprolol; Buprenorphine hcl; Morphine and related; Allegra [fexofenadine hcl]; Doxycycline monohydrate; Penicillins; Sulfa antibiotics; Sulfacetamide sodium; and Tetracyclines & related   Social History   Social History  . Marital status: Single    Spouse name: N/A  . Number of children: N/A  . Years of education: N/A   Social History Main Topics  . Smoking status: Never Smoker  . Smokeless tobacco: Never Used  . Alcohol use 0.0 oz/week     Comment: occasional beer  and wine  . Drug use: No  . Sexual activity: Not on file   Other Topics Concern  . Not on file   Social History Narrative  . No narrative on file     Family History:  [name] family history includes CAD in her father; Heart disease in her father; Hypertension in her father; Parkinsonism in her mother.   ROS:   Please see the history of present illness.    ROS All other systems reviewed and are negative.  No flowsheet data found.     PHYSICAL EXAM:   VS:  BP 126/72   Pulse 62   Ht 5\' 7"  (1.702 m)   Wt 163 lb 8 oz (74.2 kg)   SpO2 98%   BMI 25.61 kg/m    GEN: Well nourished, well developed,  in no acute distress  HEENT: normal  Neck: no JVD, carotid bruits, or masses Cardiac: RRR; no murmurs, rubs, or gallops,no edema.  Intact distal pulses bilaterally.  Respiratory:  clear to auscultation bilaterally, normal work of breathing GI: soft, nontender, nondistended, + BS MS: no deformity or atrophy  Skin: warm and dry, no rash Neuro:  Alert and Oriented x 3, Strength and sensation are intact Psych: euthymic mood, full affect  Wt Readings from Last 3 Encounters:  01/30/17 163 lb 8 oz (74.2 kg)  11/15/16 164 lb 6.4 oz (74.6 kg)  09/21/16 161 lb 12.8 oz (73.4 kg)      Studies/Labs Reviewed:   EKG:  EKG is not ordered today.    Recent Labs: 05/12/2016: ALT 15 09/15/2016: BUN 16; Creat 0.92; Hemoglobin 14.2; Magnesium 2.2; Platelets 225; Potassium 4.3; Sodium 140; TSH 3.14   Lipid Panel    Component Value Date/Time   CHOL 135 05/12/2016 0818   TRIG 55 05/12/2016 0818   HDL 76 05/12/2016 0818   CHOLHDL 1.8 05/12/2016 0818   VLDL 11 05/12/2016 0818   LDLCALC 48 05/12/2016 0818    Additional studies/ records that were reviewed today include:  none    ASSESSMENT:    1. Atrial tachycardia, paroxysmal (HCC)   2. Essential hypertension   3. Coronary artery disease involving native coronary artery of native heart without angina pectoris   4. PVC's (premature ventricular contractions)   5. Sinus bradycardia   6. Hypercholesteremia      PLAN:  In order of problems listed above:  1. Paroxysmal atrial tachycardia suppressed on BB therapy.  She will continue on BB therapy.   2. HTN - BP controlled on current meds.  She will continue on BB. 3. ASCAD - cath showed 50% prox to mid LAD, 50% ramus, 40% ostial D1, 45% distal LCX and 30% prox to mid RCA and 75% RPDA with no ischemia in inferior wall on nuclear stress test 2016 - she has had some vague chest pains in various areas of her chest that worry her.  I will repeat a stress myoview to rule out ischemia.   She will  continue on medical management including ASA/BB and statin.  4. PVC's suppressed on BB. 5. Sinus bradycardia - drug induced secondary to BB therapy for suppression of PAT. HR today is 62bpm.   6. Hyperlipidemia with LDL goal <  70.  She will continue on statin therapy.  I will check an FLP and ALT.    Medication Adjustments/Labs and Tests Ordered: Current medicines are reviewed at length with the patient today.  Concerns regarding medicines are outlined above.  Medication changes, Labs and Tests  ordered today are listed in the Patient Instructions below.  There are no Patient Instructions on file for this visit.   Signed, Maria Magic, MD  01/30/2017 10:54 AM    [facility] [address] [place], [place], [place]  [address] Phone: [phone]; Fax: [phone]

## 2017-01-30 NOTE — Patient Instructions (Signed)
Medication Instructions:  Your physician recommends that you continue on your current medications as directed. Please refer to the Current Medication list given to you today.   Labwork: TODAY: LFTs, Lipids  Testing/Procedures: Dr. Mayford Knifeurner recommends you have an EXERCISE MYOVIEW.  Follow-Up: Your physician wants you to follow-up in: 1 year with Dr. Mayford Knifeurner. You will receive a reminder letter in the mail two months in advance. If you don't receive a letter, please call our office to schedule the follow-up appointment.   Any Other Special Instructions Will Be Listed Below (If Applicable).     If you need a refill on your cardiac medications before your next appointment, please call your pharmacy.

## 2017-01-31 DIAGNOSIS — R42 Dizziness and giddiness: Secondary | ICD-10-CM | POA: Diagnosis not present

## 2017-02-01 ENCOUNTER — Ambulatory Visit (HOSPITAL_COMMUNITY): Payer: BLUE CROSS/BLUE SHIELD | Attending: Cardiovascular Disease

## 2017-02-01 ENCOUNTER — Other Ambulatory Visit: Payer: Self-pay | Admitting: *Deleted

## 2017-02-01 DIAGNOSIS — R079 Chest pain, unspecified: Secondary | ICD-10-CM

## 2017-02-01 DIAGNOSIS — E7800 Pure hypercholesterolemia, unspecified: Secondary | ICD-10-CM

## 2017-02-01 LAB — MYOCARDIAL PERFUSION IMAGING
CHL CUP MPHR: 160 {beats}/min
CHL CUP RESTING HR STRESS: 58 {beats}/min
CHL RATE OF PERCEIVED EXERTION: 18
CSEPED: 9 min
Estimated workload: 10.1 METS
LV sys vol: 23 mL
LVDIAVOL: 72 mL (ref 46–106)
Peak HR: 155 {beats}/min
Percent HR: 97 %
RATE: 0.25
SDS: 0
SRS: 3
SSS: 3
TID: 1.01

## 2017-02-01 MED ORDER — ATORVASTATIN CALCIUM 80 MG PO TABS: 80.0000 mg | ORAL_TABLET | Freq: Every day | ORAL | 3 refills | Status: DC

## 2017-02-01 MED ORDER — TECHNETIUM TC 99M TETROFOSMIN IV KIT
31.9000 | PACK | Freq: Once | INTRAVENOUS | Status: AC | PRN
  Administered 2017-02-01: 31.9 via INTRAVENOUS
  Filled 2017-02-01: qty 32

## 2017-02-01 MED ORDER — TECHNETIUM TC 99M TETROFOSMIN IV KIT
10.4000 | PACK | Freq: Once | INTRAVENOUS | Status: AC | PRN
  Administered 2017-02-01: 10.4 via INTRAVENOUS
  Filled 2017-02-01: qty 11

## 2017-02-08 DIAGNOSIS — R42 Dizziness and giddiness: Secondary | ICD-10-CM | POA: Diagnosis not present

## 2017-02-19 ENCOUNTER — Other Ambulatory Visit: Payer: Self-pay | Admitting: Cardiology

## 2017-02-20 ENCOUNTER — Telehealth: Payer: Self-pay | Admitting: Cardiology

## 2017-02-20 NOTE — Telephone Encounter (Signed)
Left message to call back  

## 2017-02-20 NOTE — Telephone Encounter (Signed)
New Message  Pt voiced she would like to speak to nurse about her cholesterol.  Please f/u

## 2017-02-21 MED ORDER — ATORVASTATIN CALCIUM 40 MG PO TABS
40.0000 mg | ORAL_TABLET | Freq: Every day | ORAL | 11 refills | Status: DC
Start: 2017-02-21 — End: 2017-07-20

## 2017-02-21 NOTE — Telephone Encounter (Signed)
Patient states she has been taking Lipitor 80 mg daily since her OV with Dr. Mayford Knife 3/26. For the first few days, she experienced aches and cramping in her legs and fatigue. Those have improved but have not totally subsided. She still feels fatigued and like she has no energy. For the last 4-5 days, she has noticed slight swelling in her hands and feet.  She notices when she takes her socks off. She states her hands swell when she walks regularly, but this is all the time.  She has no other symptoms. She requests medication recommendations prior to calling in refills for Lipitor 80 mg daily.    To Lipid Clinic.

## 2017-02-21 NOTE — Telephone Encounter (Signed)
Follow up ° ° ° ° ° °Returning a call to the nurse °

## 2017-02-21 NOTE — Telephone Encounter (Signed)
Lipitor may be the cause of her aches and cramping but is not likely causing fatigue or swelling. Would recommend decreasing Lipitor to  daily to see if this improves her aches and cramping. She can also try Coenzyme Q 10 over the counter (200-400mg  daily) to see if this helps. Would have Dr Mayford Knife advise on fatigue and swelling.

## 2017-02-21 NOTE — Telephone Encounter (Signed)
Follow Up ° ° °Pt returning phone call from nurse. Requesting call back °

## 2017-02-21 NOTE — Telephone Encounter (Signed)
Patient states she will decrease Lipitor back to 40 mg daily and see if her symptoms improve. She will call at the end of April to give an update.  She states her swelling is minimal but noticeable to her. She notices her feet swelling only when she takes off her socks. She follows a low-salt diet. Her hands don't "look like hotdogs," but she can tell they are slightly swollen. She states she got in an accident years ago and "messed up" her R foot. She states her leg is starting to feel the same way it did right after her accident - she can tell when the weather is going to be bad. Instructed her to contact PCP for R foot issues and minor swelling.  She understands she will be called if Dr. Mayford Knife has further recommendations.

## 2017-02-21 NOTE — Telephone Encounter (Signed)
Medication Detail    Disp Refills Start End   atorvastatin (LIPITOR) 40 MG tablet 30 tablet 11 02/21/2017 02/16/2018   Sig - Route: Take 1 tablet (40 mg total) by mouth daily. - Oral   E-Prescribing Status: Receipt confirmed by pharmacy (02/21/2017 4:34 PM EDT)   Pharmacy   [facility] - [place], [place] - [id number]

## 2017-02-21 NOTE — Telephone Encounter (Signed)
Left message to call back  

## 2017-03-15 ENCOUNTER — Other Ambulatory Visit: Payer: BLUE CROSS/BLUE SHIELD | Admitting: *Deleted

## 2017-03-15 DIAGNOSIS — E7800 Pure hypercholesterolemia, unspecified: Secondary | ICD-10-CM

## 2017-03-15 LAB — LIPID PANEL
CHOLESTEROL TOTAL: 130 mg/dL (ref 100–199)
Chol/HDL Ratio: 1.8 ratio (ref 0.0–4.4)
HDL: 73 mg/dL (ref >39)
LDL CALC: 46 mg/dL (ref 0–99)
Triglycerides: 53 mg/dL (ref 0–149)
VLDL CHOLESTEROL CAL: 11 mg/dL (ref 5–40)

## 2017-03-15 LAB — ALT: ALT: 16 IU/L (ref 0–32)

## 2017-05-11 DIAGNOSIS — M792 Neuralgia and neuritis, unspecified: Secondary | ICD-10-CM | POA: Diagnosis not present

## 2017-05-30 DIAGNOSIS — Z1231 Encounter for screening mammogram for malignant neoplasm of breast: Secondary | ICD-10-CM | POA: Diagnosis not present

## 2017-06-02 DIAGNOSIS — H8309 Labyrinthitis, unspecified ear: Secondary | ICD-10-CM | POA: Diagnosis not present

## 2017-06-02 DIAGNOSIS — R001 Bradycardia, unspecified: Secondary | ICD-10-CM | POA: Diagnosis not present

## 2017-06-06 ENCOUNTER — Other Ambulatory Visit: Payer: Self-pay | Admitting: Physician Assistant

## 2017-06-06 DIAGNOSIS — R2689 Other abnormalities of gait and mobility: Secondary | ICD-10-CM

## 2017-06-06 DIAGNOSIS — R42 Dizziness and giddiness: Secondary | ICD-10-CM | POA: Diagnosis not present

## 2017-06-06 DIAGNOSIS — R202 Paresthesia of skin: Secondary | ICD-10-CM | POA: Diagnosis not present

## 2017-06-06 DIAGNOSIS — R51 Headache: Secondary | ICD-10-CM | POA: Diagnosis not present

## 2017-06-08 ENCOUNTER — Ambulatory Visit
Admission: RE | Admit: 2017-06-08 | Discharge: 2017-06-08 | Disposition: A | Discharge status: Home / Self Care | Payer: BLUE CROSS/BLUE SHIELD | Source: Ambulatory Visit | Attending: Physician Assistant | Admitting: Physician Assistant

## 2017-06-08 DIAGNOSIS — R42 Dizziness and giddiness: Secondary | ICD-10-CM | POA: Diagnosis not present

## 2017-06-08 DIAGNOSIS — R2689 Other abnormalities of gait and mobility: Secondary | ICD-10-CM

## 2017-06-08 MED ORDER — GADOBENATE DIMEGLUMINE 529 MG/ML IV SOLN
15.0000 mL | Freq: Once | INTRAVENOUS | Status: AC | PRN
  Administered 2017-06-08: 15 mL via INTRAVENOUS

## 2017-06-21 IMAGING — NM NM MYOCAR MULTI W/ SPECT
3 series · 18 of 18 positions shown · non-contrast
Comparison: none

[Series 1: rest · 6.51mm/px · 6 of 64 frames shown]
[frame 6/64]
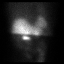
[frame 16/64]
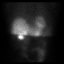
[frame 27/64]
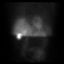
[frame 38/64]
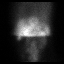
[frame 48/64]
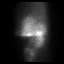
[frame 59/64]
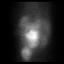

[Series 2: stress · 6.51mm/px · 6 of 512 frames shown (1 of 2)]
[frame 43/512]
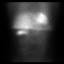
[frame 128/512]
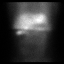
[frame 214/512]
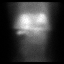
[frame 299/512]
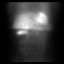
[frame 384/512]
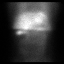
[frame 470/512]
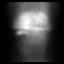

[Series 2: stress · 6.51mm/px · 6 of 64 frames shown (2 of 2)]
[frame 6/64]
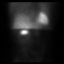
[frame 16/64]
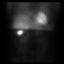
[frame 27/64]
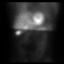
[frame 38/64]
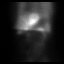
[frame 48/64]
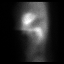
[frame 59/64]
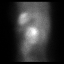

[18 of 18 positions shown; findings below may reference images not displayed]

Canned report from images found in remote index.

Refer to host system for actual result text.

## 2017-06-28 DIAGNOSIS — R202 Paresthesia of skin: Secondary | ICD-10-CM | POA: Diagnosis not present

## 2017-06-28 DIAGNOSIS — R5383 Other fatigue: Secondary | ICD-10-CM | POA: Diagnosis not present

## 2017-06-29 ENCOUNTER — Encounter: Payer: Self-pay | Admitting: Neurology

## 2017-07-17 ENCOUNTER — Other Ambulatory Visit: Payer: Self-pay

## 2017-07-17 MED ORDER — NITROGLYCERIN 0.4 MG SL SUBL: 0.4000 mg | SUBLINGUAL_TABLET | SUBLINGUAL | 0 refills | Status: DC | PRN

## 2017-07-18 ENCOUNTER — Telehealth: Payer: Self-pay | Admitting: Cardiology

## 2017-07-18 NOTE — Telephone Encounter (Signed)
Looks like pt was advised to decrease Lipitor back to 40mg  daily in April due to intolerance with 80mg  dose. If she did not tolerate the 40mg  dose, would see if she is willing to try either Lipitor 20mg  daily or try switching to Crestor 10mg  daily with repeat lipid panel checked in 2-3 months. Would also be willing to see pt in lipid clinic if she would like to discuss lipid options in person.

## 2017-07-18 NOTE — Telephone Encounter (Signed)
Pt has stopped Atorvastatin for about 3 weeks and symptoms have improved Pt is willing to try another med .Will forward to Dr Mayford Knifeurner for recommendations./cy

## 2017-07-18 NOTE — Telephone Encounter (Signed)
New Message   pt verbalized that she is calling for rn   She want to go back on a cholesterol medication

## 2017-07-18 NOTE — Telephone Encounter (Signed)
Refer to lipid clinic 

## 2017-07-20 NOTE — Telephone Encounter (Signed)
Patient states that she would like an appointment in the Lipid Clinic to discuss her options. Appointment made on 08/07/17 at 9:00 AM.

## 2017-07-20 NOTE — Telephone Encounter (Signed)
Follow up  ° ° ° °Pt is calling to follow up on this. Please call.  °

## 2017-08-07 ENCOUNTER — Ambulatory Visit (INDEPENDENT_AMBULATORY_CARE_PROVIDER_SITE_OTHER): Payer: BLUE CROSS/BLUE SHIELD | Admitting: Pharmacist

## 2017-08-07 VITALS — Wt 166.4 lb

## 2017-08-07 DIAGNOSIS — E785 Hyperlipidemia, unspecified: Secondary | ICD-10-CM | POA: Diagnosis not present

## 2017-08-07 MED ORDER — ROSUVASTATIN CALCIUM 5 MG PO TABS: ORAL_TABLET | ORAL | 11 refills | Status: DC

## 2017-08-07 NOTE — Patient Instructions (Addendum)
Thank you for coming to see [place] today!   1. Start crestor (rosuvastatin) 5 mg - take 1 tablet by mouth every other day. Increase the crestor every day in 2 weeks to daily if tolerating.   2. You can try Co-Q 10 200 mg ([employer] brand) once daily to help with muscle aches  3. We will call you in ~ 1 month to discuss tolerability.

## 2017-08-07 NOTE — Progress Notes (Addendum)
Patient ID: OCTIVIA CANION                 DOB: 1955-11-20                    MRN: [id number]     HPI: MAECYN PANNING is a 61 y.o. female patient referred to lipid clinic by Dr. Mayford Knife on 07/18/2017. PMH is significant for HTN, HLD, CAD (medical management), paroxysmal atrial tachycardia  Patient is referred to lipid clinic to discuss options as she was intolerant to multiple doses of Lipitor, despite tolerating 40 mg daily in the past. Took last dose of Lipitor on 06/27/2017. Muscle aches/twitches, swelling, and fatigue have resolved since d/c.   Patient reports she has decreased exercise a bit from 1 hr of walking daily to 3x/week.   Patient is interested in baseline lipid panel today but ate breakfast this morning.   Current Medications: none for HLD, stopped 2/2 intolerance and sx improved Intolerances: Atorvastatin 80 mg daily, 40 mg daily (aches, cramping, fatigue, swelling) Risk Factors: HTN, CAD, family h/o heart disease LDL goal: <53mmHg  Diet: cheerios and raisins + skim milk for breakfast (typical), [employer] [place] sandwich with lunch, usually has yogurt and fruit for lunch, [employer] [place] and dressing with cranberry sauce and turnip greens and cookie for supper, usually has protein, starch and vegetable for supper.   Exercise: walks at least 1 hour 3 times/week - less than usual.   Family History:  Father - heart disease (unspecified) Paternal grandparents both with heart disease Mother's siblings have died from heart attacks.   Social History: No other drugs, 4 servings/week of EtOH  Labs: (03/15/2017) fasting lipid panel:  TC:130 TG: 53 HDL: 73 VLDL: 11 LDL: 46  (03/15/2017) ALT: 16 (wnl)  Past Medical History:  Diagnosis Date  . Atrial tachycardia, paroxysmal (HCC)   . Colonic polyp   . Coronary artery disease 2016   50% prox to mid LAD, 50% ramus, 40% ostial D1, 45% distal LCX and 30% prox to mid RCA and 75% RPDA with no ischemia in inferior wall on nuclear stress test - on  medical management with no angina.  . DDD (degenerative disc disease)   . Hypercholesteremia   . Hypertension   . PAC (premature atrial contraction)   . Pulmonary nodule    a. being followed by PCP, stable 07/2016.  [name] PVC's (premature ventricular contractions)   . Sinus bradycardia   . Vertigo     Current Outpatient Prescriptions on File Prior to Visit  Medication Sig Dispense Refill  . ALPRAZolam (XANAX) 0.5 MG tablet Take 0.125-0.25 mg by mouth at bedtime as needed for anxiety or sleep.    [name] aspirin 81 MG tablet Take 81 mg by mouth daily.    . fluticasone (FLONASE) 50 MCG/ACT nasal spray Place 2 sprays into both nostrils as needed.   11  . loratadine (CLARITIN) 10 MG tablet Take 10 mg by mouth daily as needed for allergies.     [name] nebivolol (BYSTOLIC) 2.5 MG tablet Take 1 tablet by mouth daily and may take 1 extra tablet as needed for palpitations daily 180 tablet 1  . nitroGLYCERIN (NITROSTAT) 0.4 MG SL tablet Place 1 tablet (0.4 mg total) under the tongue every 5 (five) minutes as needed for chest pain. 25 tablet 0  . Polyethyl Glycol-Propyl Glycol (SYSTANE OP) Place 2 drops into both eyes as needed (for dry eyes).     No current facility-administered medications on file prior  to visit.     Allergies  Allergen Reactions  . Sudafed [Pseudoephedrine Hcl] Other (See Comments)    Heart rate goes really fast   . Metoprolol Other (See Comments)    Severe fatigue   . Buprenorphine Hcl Other (See Comments)    Nausea and severe anxiety  . Morphine And Related Other (See Comments)    Nausea and severe anxiety  . Allegra [Fexofenadine Hcl] Other (See Comments)    achey   . Doxycycline Monohydrate Swelling  . Penicillins Swelling  . Sulfa Antibiotics Itching  . Sulfacetamide Sodium Itching  . Tetracyclines & Related Swelling    Assessment/Plan:  1. Hyperlipidemia - LDL goal < 70 given hx of ASCVD. LDL previously well controlled (LDL 46) on high intensity statin, however unable  to tolerate dose increase or decrease back to previous dose of lipitor. Has not tried any other statins at this point. Willing to try another statin.  1. Start crestor 5 mg every other day, increase to once daily in 2 weeks. 2. Start Co-Q 10 200 mg once daily [employer] brand recommended) 3. Baseline lipid panel tomorrow before starting statin 4. Follow up in 1 month over the phone to discuss tolerability and adjust dose as needed. Will schedule lipid panel at that time.  Allena Katz, Pharm.D. PGY2 Ambulatory Care Pharmacy Resident Phone: [phone]

## 2017-08-08 ENCOUNTER — Other Ambulatory Visit: Payer: BLUE CROSS/BLUE SHIELD

## 2017-08-08 DIAGNOSIS — E785 Hyperlipidemia, unspecified: Secondary | ICD-10-CM | POA: Diagnosis not present

## 2017-08-08 LAB — LIPID PANEL
Chol/HDL Ratio: 2.8 ratio (ref 0.0–4.4)
Cholesterol, Total: 247 mg/dL — ABNORMAL HIGH (ref 100–199)
HDL: 89 mg/dL (ref >39)
LDL CALC: 144 mg/dL — AB (ref 0–99)
Triglycerides: 68 mg/dL (ref 0–149)
VLDL Cholesterol Cal: 14 mg/dL (ref 5–40)

## 2017-08-10 ENCOUNTER — Other Ambulatory Visit: Payer: Self-pay | Admitting: *Deleted

## 2017-08-10 DIAGNOSIS — Z79899 Other long term (current) drug therapy: Secondary | ICD-10-CM

## 2017-08-10 DIAGNOSIS — E785 Hyperlipidemia, unspecified: Secondary | ICD-10-CM

## 2017-08-10 MED ORDER — ROSUVASTATIN CALCIUM 10 MG PO TABS: 10.0000 mg | ORAL_TABLET | Freq: Every day | ORAL | 0 refills | Status: DC

## 2017-08-30 DIAGNOSIS — Z23 Encounter for immunization: Secondary | ICD-10-CM | POA: Diagnosis not present

## 2017-09-04 ENCOUNTER — Telehealth: Payer: Self-pay | Admitting: Pharmacist

## 2017-09-04 NOTE — Telephone Encounter (Signed)
Called pt to follow up on tolerance of crestor since initiation at last lipid clinic visit on 08/07/2017. Pt had her cholesterol panel result on 08/08/2017 and Dr. Mayford Knifeurner increased crestor to 10 mg daily. Pt reports she has tolerated increase to 10 mg daily without any side effects. Continues to take Co Q 10 daily which she thinks is helping.   Pt confirms f/u lipid panel on 09/20/2017 and expresses appreciation for the call.  Allena Katzaroline E Zannah Melucci, Pharm.D. PGY2 Ambulatory Care Pharmacy Resident Phone: [phone]

## 2017-09-12 ENCOUNTER — Ambulatory Visit: Payer: BLUE CROSS/BLUE SHIELD | Admitting: Neurology

## 2017-09-12 ENCOUNTER — Encounter: Payer: Self-pay | Admitting: Neurology

## 2017-09-12 ENCOUNTER — Other Ambulatory Visit (INDEPENDENT_AMBULATORY_CARE_PROVIDER_SITE_OTHER): Payer: BLUE CROSS/BLUE SHIELD

## 2017-09-12 VITALS — BP 120/70 | HR 65 | Ht 67.0 in | Wt 168.4 lb

## 2017-09-12 DIAGNOSIS — R202 Paresthesia of skin: Secondary | ICD-10-CM | POA: Diagnosis not present

## 2017-09-12 LAB — SEDIMENTATION RATE: SED RATE: 7 mm/h (ref 0–30)

## 2017-09-12 NOTE — Patient Instructions (Addendum)
Check labs  NCS/EMG of the left arm and leg  We will call you with the results of your testing

## 2017-09-12 NOTE — Progress Notes (Signed)
[facility] Clinic Note - Initial Visit   Date: 09/12/17  Maria Cochran MRN: [id number] DOB: [birth year]   Dear Dr. Marisue Humble:  Thank you for your kind referral of Maria Cochran for consultation of left foot numbness. Although her history is well known to you, please allow [place] to reiterate it for the purpose of our medical record. The patient was accompanied to the clinic by self.    History of Present Illness: Maria Cochran is a 61 y.o. right-handed Caucasian female with hyperlipidemia, CAD, PAC/PVCs, and anxiety presenting for evaluation of left foot numbness.    Starting around July 2018, she started having numbness over the top of the left foot.  Later, she also began experiencing numbness of the left lateral forearm and left side of the cheek and lips.  Symptoms are intermittent, lasting about an hour and occurring a about once per week.  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   She denies any neck pain.  She has not noticed anything that helps.  There is no associated pain.  MRI brain from August 2018 showed mild microvascular changes, no evidence of demyelinating disease.  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   Out-side paper records, electronic medical record, and images have been reviewed where available and summarized as:  Labs 06/06/2017:  Folate >20, vitamin B12 369, TSH 2.37, vitamin D 29*  MRI brain wwo contrast 06/08/2017: 1. Mild patchy T2/FLAIR hyperintensity involving the supratentorial cerebral white matter, nonspecific, but most likely related to chronic small vessel ischemic disease. Changes are mild for age. 2. 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 This is felt to be incidental in nature, and of no clinical significance. 3. Otherwise normal brain MRI.  Past Medical History:  Diagnosis Date  . Atrial tachycardia, paroxysmal ([facility])   . Colonic  polyp   . Coronary artery disease 2016   50% prox to mid LAD, 50% ramus, 40% ostial D1, 45% distal LCX and 30% prox to mid RCA and 75% RPDA with no ischemia in inferior wall on nuclear stress test - on medical management with no angina.  . DDD (degenerative disc disease)   . Hypercholesteremia   . Hypertension   . PAC (premature atrial contraction)   . Pulmonary nodule    a. being followed by PCP, stable 07/2016.  [name] PVC's (premature ventricular contractions)   . Sinus bradycardia   . Vertigo     Past Surgical History:  Procedure Laterality Date  . TONSILLECTOMY       Medications:  Outpatient Encounter Medications as of 09/12/2017  Medication Sig  . ALPRAZolam (XANAX) 0.5 MG tablet Take 0.125-0.25 mg by mouth at bedtime as needed for anxiety or sleep.  [name] aspirin 81 MG tablet Take 81 mg by mouth daily.  . Coenzyme Q10 (COQ10 PO) Take by mouth.  . fluticasone (FLONASE) 50 MCG/ACT nasal spray Place 2 sprays into both nostrils as needed.   . loratadine (CLARITIN) 10 MG tablet Take 10 mg by mouth daily as needed for allergies.   [name] nebivolol (BYSTOLIC) 2.5 MG tablet Take 1 tablet by mouth daily and may take 1 extra tablet as needed for palpitations daily  . nitroGLYCERIN (NITROSTAT) 0.4 MG SL tablet Place 1 tablet (0.4 mg total) under the tongue every 5 (five) minutes as needed for chest pain.  Vladimir Faster Glycol-Propyl Glycol (SYSTANE OP) Place  2 drops into both eyes as needed (for dry eyes).  . rosuvastatin (CRESTOR) 10 MG tablet Take 1 tablet (10 mg total) by mouth daily.   No facility-administered encounter medications on file as of 09/12/2017.      Allergies:  Allergies  Allergen Reactions  . Sudafed [Pseudoephedrine Hcl] Other (See Comments)    Heart rate goes really fast   . Metoprolol Other (See Comments)    Severe fatigue   . Buprenorphine Hcl Other (See Comments)    Nausea and severe anxiety  . Morphine And Related Other (See Comments)    Nausea and severe anxiety  .  Allegra [Fexofenadine Hcl] Other (See Comments)    achey   . Doxycycline Monohydrate Swelling  . Lipitor [Atorvastatin] Other (See Comments)    Muscle aches, fatigue, swelling, muscle twitching  . Penicillins Swelling  . Sulfa Antibiotics Itching  . Sulfacetamide Sodium Itching  . Tetracyclines & Related Swelling    Family History: Family History  Problem Relation Age of Onset  . Hypertension Father   . CAD Father   . Heart disease Father   . Parkinsonism Mother     Social History: Social History   Tobacco Use  . Smoking status: Never Smoker  . Smokeless tobacco: Never Used  Substance Use Topics  . Alcohol use: Yes    Alcohol/week: 0.0 oz    Comment: occasional beer and wine  . Drug use: No   Social History   Social History Narrative   Lives with partner in a one story home.  Has no children.  Works as a Geophysicist/field seismologist.  Education: college.     Review of Systems:  CONSTITUTIONAL: No fevers, chills, night sweats, or weight loss.   EYES: No visual changes or eye pain ENT: No hearing changes.  No history of nose bleeds.   RESPIRATORY: No cough, wheezing and shortness of breath.   CARDIOVASCULAR: Negative for chest pain, and palpitations.   GI: Negative for abdominal discomfort, blood in stools or black stools.  No recent change in bowel habits.   GU:  No history of incontinence.   MUSCLOSKELETAL: No history of joint pain or swelling.  No myalgias.   SKIN: Negative for lesions, rash, and itching.   HEMATOLOGY/ONCOLOGY: Negative for prolonged bleeding, bruising easily, and swollen nodes.  No history of cancer.   ENDOCRINE: Negative for cold or heat intolerance, polydipsia or goiter.   PSYCH:  No depression or anxiety symptoms.   NEURO: As Above.   Vital Signs:  BP 120/70   Pulse 65   Ht 5' 7"  (1.702 m)   Wt 168 lb 7 oz (76.4 kg)   SpO2 100%   BMI 26.38 kg/m  Pain Scale: 0 on a scale of 0-10   General Medical Exam:   General:  Well appearing, comfortable.     Eyes/ENT: see cranial nerve examination.   Neck: No masses appreciated.  Full range of motion without tenderness.  No carotid bruits. Respiratory:  Clear to auscultation, good air entry bilaterally.   Cardiac:  Regular rate and rhythm, no murmur.   Extremities:  No deformities, edema, or skin discoloration.  Skin:  No rashes or lesions.  Neurological Exam: MENTAL STATUS including orientation to time, place, person, recent and remote memory, attention span and concentration, language, and fund of knowledge is normal.  Speech is not dysarthric.  CRANIAL NERVES: II:  No visual field defects.  Unremarkable fundi.   III-IV-VI: Pupils equal round and reactive to light.  Normal conjugate,  extra-ocular eye movements in all directions of gaze.  No nystagmus.  No ptosis.   V:  Normal facial sensation.     VII:  Normal facial symmetry and movements.   VIII:  Normal hearing and vestibular function.   IX-X:  Normal palatal movement.   XI:  Normal shoulder shrug and head rotation.   XII:  Normal tongue strength and range of motion, no deviation or fasciculation.  MOTOR:  No atrophy, fasciculations or abnormal movements.  No pronator drift.  Tone is normal.    Right Upper Extremity:    Left Upper Extremity:    Deltoid  5/5   Deltoid  5/5   Biceps  5/5   Biceps  5/5   Triceps  5/5   Triceps  5/5   Wrist extensors  5/5   Wrist extensors  5/5   Wrist flexors  5/5   Wrist flexors  5/5   Finger extensors  5/5   Finger extensors  5/5   Finger flexors  5/5   Finger flexors  5/5   Dorsal interossei  5/5   Dorsal interossei  5/5   Abductor pollicis  5/5   Abductor pollicis  5/5   Tone (Ashworth scale)  0  Tone (Ashworth scale)  0   Right Lower Extremity:    Left Lower Extremity:    Hip flexors  5/5   Hip flexors  5/5   Hip extensors  5/5   Hip extensors  5/5   Knee flexors  5/5   Knee flexors  5/5   Knee extensors  5/5   Knee extensors  5/5   Dorsiflexors  5/5   Dorsiflexors  5/5   Plantarflexors   5/5   Plantarflexors  5/5   Toe extensors  5/5   Toe extensors  5/5   Toe flexors  5/5   Toe flexors  5/5   Tone (Ashworth scale)  0  Tone (Ashworth scale)  0   MSRs:  Right                                                                 Left brachioradialis 2+  brachioradialis 2+  biceps 2+  biceps 2+  triceps 2+  triceps 2+  patellar 2+  patellar 2+  ankle jerk 2+  ankle jerk 2+  Hoffman no  Hoffman no  plantar response down  plantar response down   SENSORY:  Normal and symmetric perception of light touch, pinprick, vibration, and proprioception.  Romberg's sign absent.   COORDINATION/GAIT: Normal finger-to- nose-finger and heel-to-shin.  Intact rapid alternating movements bilaterally.  Able to rise from a chair without using arms.  Gait narrow based and stable. Tandem and stressed gait intact.    IMPRESSION: Ms. Wik is a 61 year-old female referred for evaluation of intermittent numbness over the left cheek, lateral forearm, and dorsum of the foot.  There is no evidence of demyelinating disease on her MRI brain performed in August 2018.  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  Symptoms are too long (> 1hr) to represent focal sensory seizure.  I will first evaluate for peripheral nervous system etiology such as cervical/lumbosacral radiculopathy or nerve entrapment.  If present, this would not explain her left face paresthesias and she may need EEG going forward.  PLAN/RECOMMENDATIONS:  1.  Check vitamin B1, MMA, copper, SPEP with IFE, ESR 2.  NCS/EMG of the left arm and leg 3.  Consider EEG, if testing is normal  Further recommendations will be based on her results   Thank you for allowing me to participate in patient's care.  If I can answer any additional questions, I would be pleased to do so.    Sincerely,    Donika K. Posey Pronto, DO

## 2017-09-17 LAB — PROTEIN ELECTROPHORESIS, SERUM
ALPHA 1: 0.3 g/dL (ref 0.2–0.3)
ALPHA 2: 0.6 g/dL (ref 0.5–0.9)
Albumin ELP: 4.2 g/dL (ref 3.8–4.8)
BETA 2: 0.2 g/dL (ref 0.2–0.5)
BETA GLOBULIN: 0.4 g/dL (ref 0.4–0.6)
Gamma Globulin: 0.8 g/dL (ref 0.8–1.7)
Total Protein: 6.5 g/dL (ref 6.1–8.1)

## 2017-09-17 LAB — IMMUNOFIXATION ELECTROPHORESIS
IGM, SERUM: 56 mg/dL (ref 48–271)
IMMUNOFIX ELECTR INT: NOT DETECTED
IMMUNOGLOBULIN A: 96 mg/dL (ref 81–463)
IgG (Immunoglobin G), Serum: 855 mg/dL (ref 694–1618)

## 2017-09-17 LAB — VITAMIN B1: Vitamin B1 (Thiamine): 30 nmol/L (ref 8–30)

## 2017-09-17 LAB — COPPER, SERUM: Copper: 98 ug/dL (ref 70–175)

## 2017-09-17 LAB — METHYLMALONIC ACID, SERUM: Methylmalonic Acid, Quant: 208 nmol/L (ref 87–318)

## 2017-09-20 ENCOUNTER — Other Ambulatory Visit: Payer: BLUE CROSS/BLUE SHIELD | Admitting: *Deleted

## 2017-09-20 ENCOUNTER — Other Ambulatory Visit: Payer: Self-pay | Admitting: Cardiology

## 2017-09-20 DIAGNOSIS — Z79899 Other long term (current) drug therapy: Secondary | ICD-10-CM | POA: Diagnosis not present

## 2017-09-20 DIAGNOSIS — E785 Hyperlipidemia, unspecified: Secondary | ICD-10-CM

## 2017-09-20 LAB — LIPID PANEL
CHOL/HDL RATIO: 2 ratio (ref 0.0–4.4)
CHOLESTEROL TOTAL: 159 mg/dL (ref 100–199)
HDL: 80 mg/dL (ref >39)
LDL CALC: 68 mg/dL (ref 0–99)
Triglycerides: 55 mg/dL (ref 0–149)
VLDL CHOLESTEROL CAL: 11 mg/dL (ref 5–40)

## 2017-09-20 LAB — ALT: ALT: 25 IU/L (ref 0–32)

## 2017-09-26 DIAGNOSIS — Z0000 Encounter for general adult medical examination without abnormal findings: Secondary | ICD-10-CM | POA: Diagnosis not present

## 2017-09-26 DIAGNOSIS — I491 Atrial premature depolarization: Secondary | ICD-10-CM | POA: Diagnosis not present

## 2017-09-26 DIAGNOSIS — E7800 Pure hypercholesterolemia, unspecified: Secondary | ICD-10-CM | POA: Diagnosis not present

## 2017-09-26 DIAGNOSIS — I493 Ventricular premature depolarization: Secondary | ICD-10-CM | POA: Diagnosis not present

## 2017-10-02 ENCOUNTER — Telehealth: Payer: Self-pay | Admitting: Neurology

## 2017-10-02 NOTE — Telephone Encounter (Signed)
Patient's cell # is [phone]. Patient called and needed to cancel her EMG for this Thursday due to going out of town. She has some questions for you before she reschedules the EMG. Please Call cell. Thanks

## 2017-10-02 NOTE — Telephone Encounter (Signed)
Left message for patient to call me back. 

## 2017-10-04 ENCOUNTER — Encounter: Payer: BLUE CROSS/BLUE SHIELD | Admitting: Neurology

## 2017-10-08 ENCOUNTER — Other Ambulatory Visit: Payer: Self-pay | Admitting: Physician Assistant

## 2017-11-16 ENCOUNTER — Other Ambulatory Visit: Payer: Self-pay | Admitting: Cardiology

## 2017-12-03 DIAGNOSIS — Z86010 Personal history of colonic polyps: Secondary | ICD-10-CM | POA: Diagnosis not present

## 2017-12-11 DIAGNOSIS — R220 Localized swelling, mass and lump, head: Secondary | ICD-10-CM | POA: Diagnosis not present

## 2018-02-13 ENCOUNTER — Ambulatory Visit (INDEPENDENT_AMBULATORY_CARE_PROVIDER_SITE_OTHER)
Admission: RE | Admit: 2018-02-13 | Discharge: 2018-02-13 | Disposition: A | Discharge status: Home / Self Care | Payer: BLUE CROSS/BLUE SHIELD | Source: Ambulatory Visit | Attending: Cardiology | Admitting: Cardiology

## 2018-02-13 ENCOUNTER — Encounter: Payer: Self-pay | Admitting: Cardiology

## 2018-02-13 ENCOUNTER — Ambulatory Visit: Payer: BLUE CROSS/BLUE SHIELD | Admitting: Cardiology

## 2018-02-13 VITALS — BP 122/76 | HR 63 | Ht 67.0 in | Wt 171.0 lb

## 2018-02-13 DIAGNOSIS — I4719 Other supraventricular tachycardia: Secondary | ICD-10-CM

## 2018-02-13 DIAGNOSIS — I10 Essential (primary) hypertension: Secondary | ICD-10-CM

## 2018-02-13 DIAGNOSIS — I471 Supraventricular tachycardia: Secondary | ICD-10-CM | POA: Diagnosis not present

## 2018-02-13 DIAGNOSIS — E7800 Pure hypercholesterolemia, unspecified: Secondary | ICD-10-CM | POA: Diagnosis not present

## 2018-02-13 DIAGNOSIS — R202 Paresthesia of skin: Secondary | ICD-10-CM

## 2018-02-13 DIAGNOSIS — I2510 Atherosclerotic heart disease of native coronary artery without angina pectoris: Secondary | ICD-10-CM | POA: Diagnosis not present

## 2018-02-13 DIAGNOSIS — G459 Transient cerebral ischemic attack, unspecified: Secondary | ICD-10-CM | POA: Diagnosis not present

## 2018-02-13 NOTE — Progress Notes (Signed)
Cardiology Office Note:    Date:  02/13/2018   ID:  Maria Cochran, DOB 05/20/1956, MRN [id number]  PCP:  Blair Heys, MD  Cardiologist:  No primary care provider on file.    Referring MD: Blair Heys, MD   Chief Complaint  Patient presents with  . Coronary Artery Disease  . Hypertension  . Hyperlipidemia    History of Present Illness:    Maria Cochran is a 62 y.o. female with a hx of PVC's and nonsustained atrial tachycardia, dyslipidemia and borderline obstructive ASCAD of the RPDA of 75% with no ischemia on nuclear stress test and nonobstructive elsewhere by cath on medical management.  She is here today for followup and is doing well.  She denies any chest pain or pressure, SOB, DOE, PND, orthopnea,  palpitations or syncope.  She occasionally has some problems with dizziness if she stands up too fast.  One thing she says she has been having recently has had what she calls "sinking" spells.  She says she gets a sinking feeling in her chest and she is not sure if it is extra heartbeats but she does not feel like she is going to pass out.  She also has been having some intermittent numbness and tingling under her nose in the perioral region along with the same sensation in her left arm.  This is random and is not associated with exertion.  She says about a year ago she had some problems with left hand and foot numbness and tingling and saw a neurologist but no diagnosis was ever established and it went away so she did not pursue it further.  She is concerned that this could be her heart since she has had nonobstructive coronary disease diagnosed in the past.  Her last stress test was in October 2016 which showed no ischemia.  She is compliant with her meds and is tolerating meds with no SE.    Past Medical History:  Diagnosis Date  . Atrial tachycardia, paroxysmal ([facility])   . Colonic polyp   . Coronary artery disease 2016   50% prox to mid LAD, 50% ramus, 40% ostial D1, 45% distal LCX and  30% prox to mid RCA and 75% RPDA with no ischemia in inferior wall on nuclear stress test - on medical management with no angina.  . DDD (degenerative disc disease)   . Hypercholesteremia   . Hypertension   . PAC (premature atrial contraction)   . Pulmonary nodule    a. being followed by PCP, stable 07/2016.  [name] PVC's (premature ventricular contractions)   . Sinus bradycardia   . Vertigo     Past Surgical History:  Procedure Laterality Date  . CARDIAC CATHETERIZATION N/A 08/13/2015   Procedure: Left Heart Cath and Coronary Angiography;  Surgeon: Lyn Records, MD;  [facility];  Service: Cardiovascular;  Laterality: N/A;  . TONSILLECTOMY      Current Medications: Current Meds  Medication Sig  . ALPRAZolam (XANAX) 0.5 MG tablet Take 0.125-0.25 mg by mouth at bedtime as needed for anxiety or sleep.  [name] aspirin 81 MG tablet Take 81 mg by mouth daily.  . Coenzyme Q10 (COQ10 PO) Take 1 capsule by mouth daily.   . fluticasone (FLONASE) 50 MCG/ACT nasal spray Place 2 sprays into both nostrils as needed.   . loratadine (CLARITIN) 10 MG tablet Take 10 mg by mouth daily as needed for allergies.   [name] nebivolol (BYSTOLIC) 2.5 MG tablet TAKE 1 TABLET DAILY  AND MAY TAKE 1 EXTRA TABLET AS NEEDED FOR PALPITATIONS DAILY.  . nitroGLYCERIN (NITROSTAT) 0.4 MG SL tablet Place 1 tablet (0.4 mg total) under the tongue every 5 (five) minutes as needed for chest pain.  [name]. Polyethyl Glycol-Propyl Glycol (SYSTANE OP) Place 2 drops into both eyes as needed (for dry eyes).  . rosuvastatin (CRESTOR) 10 MG tablet TAKE 1 TABLET BY MOUTH DAILY.     Allergies:   Sudafed [pseudoephedrine hcl]; Metoprolol; Buprenorphine hcl; Morphine and related; Allegra [fexofenadine hcl]; Doxycycline monohydrate; Lipitor [atorvastatin]; Penicillins; Sulfa antibiotics; Sulfacetamide sodium; and Tetracyclines & related   Social History   Socioeconomic History  . Marital status: Single    Spouse name: Not on file  .  Number of children: 0  . Years of education: 3116  . Highest education level: Not on file  Occupational History  . Occupation: Environmental managerphotographer  Social Needs  . Financial resource strain: Not on file  . Food insecurity:    Worry: Not on file    Inability: Not on file  . Transportation needs:    Medical: Not on file    Non-medical: Not on file  Tobacco Use  . Smoking status: Never Smoker  . Smokeless tobacco: Never Used  Substance and Sexual Activity  . Alcohol use: Yes    Alcohol/week: 0.0 oz    Comment: occasional beer and wine  . Drug use: No  . Sexual activity: Not on file  Lifestyle  . Physical activity:    Days per week: Not on file    Minutes per session: Not on file  . Stress: Not on file  Relationships  . Social connections:    Talks on phone: Not on file    Gets together: Not on file    Attends religious service: Not on file    Active member of club or organization: Not on file    Attends meetings of clubs or organizations: Not on file    Relationship status: Not on file  Other Topics Concern  . Not on file  Social History Narrative   Lives with partner in a one story home.  Has no children.  Works as a Environmental managerphotographer.  Education: college.      Family History: [name] family history includes CAD in her father; Heart disease in her father; Hypertension in her father; Parkinsonism in her mother.  ROS:   Please see the history of present illness.    ROS  All other systems reviewed and negative.   EKGs/Labs/Other Studies Reviewed:    The following studies were reviewed today: none  EKG:  EKG is  ordered today.  The ekg ordered today demonstrates NSR at 63 bpm with no ST changes  Recent Labs: 09/20/2017: ALT 25   Recent Lipid Panel    Component Value Date/Time   CHOL 159 09/20/2017 0000   TRIG 55 09/20/2017 0000   HDL 80 09/20/2017 0000   CHOLHDL 2.0 09/20/2017 0000   CHOLHDL 1.8 05/12/2016 0818   VLDL 11 05/12/2016 0818   LDLCALC 68 09/20/2017  0000    Physical Exam:    VS:  BP 122/76   Pulse 63   Ht 5\' 7"  (1.702 m)   Wt 171 lb (77.6 kg)   SpO2 98%   BMI 26.78 kg/m     Wt Readings from Last 3 Encounters:  02/13/18 171 lb (77.6 kg)  09/12/17 168 lb 7 oz (76.4 kg)  08/07/17 166 lb 6.4 oz (75.5 kg)     GEN:  Well nourished, well developed in no acute distress HEENT: Normal NECK: No JVD; No carotid bruits LYMPHATICS: No lymphadenopathy CARDIAC: RRR, no murmurs, rubs, gallops RESPIRATORY:  Clear to auscultation without rales, wheezing or rhonchi  ABDOMEN: Soft, non-tender, non-distended MUSCULOSKELETAL:  No edema; No deformity  SKIN: Warm and dry NEUROLOGIC:  Alert and oriented x 3 PSYCHIATRIC:  Normal affect   ASSESSMENT:    1. Atrial tachycardia, paroxysmal ([facility])   2. Coronary artery disease involving native coronary artery of native heart without angina pectoris   3. Essential hypertension   4. Pure hypercholesterolemia   5. TIA (transient ischemic attack)   6. Left face and left arm tingling    PLAN:    In order of problems listed above:  1.  Nonsustained atrial tachycardia - she has been having some sinking sensations that are hard for her to describe.  She does not feel like she is going to pass out with them but feels an empty sensation in her chest.  I am wondering whether she may be having PVCs.  I will get a 30-day event monitor to assess further.  She will continue on Bystolic 2.5 mg daily.  2.  ASCAD - borderline obstructive ASCAD of the RPDA of 75% with no ischemia on nuclear stress test and nonobstructive elsewhere by cath on medical management.  She denies any anginal symptoms but has been having some strange symptoms of numbness and tingling in the perioral region as well as in her left arm episodically.  She does have moderate obstructive disease with 50% stenosis in several areas and RPDA of 75% by cath in 2016.  Her last nuclear stress test was 3 years ago.  I recommend proceeding with stress  Myoview to rule out ischemia.  She will continue on aspirin 81 mg daily, beta-blocker and statin.  3.  HTN - BP is well controlled on exam today.  She will continue on Bystolic 2.5 mg daily.  4.  Hyperlipidemia with LDL goal < 70.  She will continue on Crestor 10 mg daily.  Her LDL was 68 on 09/20/2017.  5.  Left arm tingling -she is apparently had this before associated with left foot tingling and saw her primary was referred to neurology but had no diagnosis made.  She is now having perioral numbness and left arm tingling and numbness again that is sporadic.  She is in the age group for multiple sclerosis.  I will get a noncontrasted head CT to rule out CVA or mass but have recommended that she follow-up with her primary doctor and consider repeat referral back to neurology.  Medication Adjustments/Labs and Tests Ordered: Current medicines are reviewed at length with the patient today.  Concerns regarding medicines are outlined above.  Orders Placed This Encounter  Procedures  . CT HEAD WO CONTRAST  . CARDIAC EVENT MONITOR  . MYOCARDIAL PERFUSION IMAGING  . EKG 12-Lead   No orders of the defined types were placed in this encounter.   Signed, Armanda Magic, MD  02/13/2018 8:54 AM    [facility]

## 2018-02-13 NOTE — Patient Instructions (Signed)
Medication Instructions:  Your physician recommends that you continue on your current medications as directed. Please refer to the Current Medication list given to you today.  If you need a refill on your cardiac medications, please contact your pharmacy first.  Labwork: None ordered   Testing/Procedures: Non-Cardiac CT scanning, (CAT scanning), is a noninvasive, special x-ray that produces cross-sectional images of the body using x-rays and a computer. CT scans help physicians diagnose and treat medical conditions. For some CT exams, a contrast material is used to enhance visibility in the area of the body being studied. CT scans provide greater clarity and reveal more details than regular x-ray exams.  Your physician has requested that you have en exercise stress myoview. For further information please visit [email or web address]. Please follow instruction sheet, as given.  Your physician has recommended that you wear an event monitor. Event monitors are medical devices that record the heart's electrical activity. Doctors most often us these monitors to diagnose arrhythmias. Arrhythmias are problems with the speed or rhythm of the heartbeat. The monitor is a small, portable device. You can wear one while you do your normal daily activities. This is usually used to diagnose what is causing palpitations/syncope (passing out).   Follow-Up: Your physician wants you to follow-up in: 1 year with Dr. Mayford Knifeurner. You will receive a reminder letter in the mail two months in advance. If you don't receive a letter, please call our office to schedule the follow-up appointment.  Any Other Special Instructions Will Be Listed Below (If Applicable).   Thank you for choosing [facility]    Lyda PeroneRena Jayshun Galentine, RN  [phone]  If you need a refill on your cardiac medications before your next appointment, please call your pharmacy.

## 2018-02-15 ENCOUNTER — Telehealth: Payer: Self-pay | Admitting: Cardiology

## 2018-02-15 NOTE — Telephone Encounter (Signed)
New Message   Patient is returning call. She states that she received a message to call the office back. Yet she does not know why. Please call to discuss.

## 2018-02-15 NOTE — Telephone Encounter (Signed)
Patient made aware of CT results and Dr. Norris Crossurner's recommendation to f/u with PCP regarding CT findings. She verbalized understanding and thankful for the call

## 2018-02-18 ENCOUNTER — Other Ambulatory Visit: Payer: Self-pay | Admitting: Cardiology

## 2018-02-20 ENCOUNTER — Telehealth (HOSPITAL_COMMUNITY): Payer: Self-pay | Admitting: *Deleted

## 2018-02-20 NOTE — Telephone Encounter (Signed)
Patient given detailed instructions per Myocardial Perfusion Study Information Sheet for the test on 02/25/18. Patient notified to arrive 15 minutes early and that it is imperative to arrive on time for appointment to keep from having the test rescheduled.  If you need to cancel or reschedule your appointment, please call the office within 24 hours of your appointment. . Patient verbalized understanding. Maria Cochran    

## 2018-02-25 ENCOUNTER — Ambulatory Visit (INDEPENDENT_AMBULATORY_CARE_PROVIDER_SITE_OTHER): Payer: BLUE CROSS/BLUE SHIELD

## 2018-02-25 ENCOUNTER — Ambulatory Visit (HOSPITAL_COMMUNITY): Payer: BLUE CROSS/BLUE SHIELD | Attending: Internal Medicine

## 2018-02-25 DIAGNOSIS — I2510 Atherosclerotic heart disease of native coronary artery without angina pectoris: Secondary | ICD-10-CM | POA: Diagnosis not present

## 2018-02-25 DIAGNOSIS — I471 Supraventricular tachycardia: Secondary | ICD-10-CM | POA: Diagnosis not present

## 2018-02-25 LAB — MYOCARDIAL PERFUSION IMAGING
CHL CUP NUCLEAR SSS: 9
CHL CUP RESTING HR STRESS: 60 {beats}/min
CSEPEW: 10.1 METS
CSEPHR: 99 %
Exercise duration (min): 9 min
Exercise duration (sec): 3 s
LV dias vol: 72 mL (ref 46–106)
LVSYSVOL: 23 mL
MPHR: 159 {beats}/min
NUC STRESS TID: 1.02
Peak HR: 157 {beats}/min
RATE: 0.25
SDS: 6
SRS: 3

## 2018-02-25 MED ORDER — TECHNETIUM TC 99M TETROFOSMIN IV KIT
10.6000 | PACK | Freq: Once | INTRAVENOUS | Status: AC | PRN
  Administered 2018-02-25: 10.6 via INTRAVENOUS
  Filled 2018-02-25: qty 11

## 2018-02-25 MED ORDER — TECHNETIUM TC 99M TETROFOSMIN IV KIT
32.7000 | PACK | Freq: Once | INTRAVENOUS | Status: AC | PRN
  Administered 2018-02-25: 32.7 via INTRAVENOUS
  Filled 2018-02-25: qty 33

## 2018-02-27 DIAGNOSIS — R202 Paresthesia of skin: Secondary | ICD-10-CM | POA: Diagnosis not present

## 2018-02-27 DIAGNOSIS — I471 Supraventricular tachycardia: Secondary | ICD-10-CM | POA: Diagnosis not present

## 2018-03-06 DIAGNOSIS — T7840XA Allergy, unspecified, initial encounter: Secondary | ICD-10-CM | POA: Diagnosis not present

## 2018-03-18 ENCOUNTER — Telehealth: Payer: Self-pay | Admitting: Cardiology

## 2018-03-18 NOTE — Telephone Encounter (Signed)
Patient states she is allergic to the adhesive that is used for the heart monitor. She has contacted the company, they sent out a cloth version and nickel free version. She also stated she has taken benadryl and applied some hydrocortisone cream to help relieve the symptoms. Patient stated she was only able to wear the monitor for about 7 days. I informed her I would forward to Dr. Mayford Knife for further recommendations. She was thankful for the call

## 2018-03-18 NOTE — Telephone Encounter (Signed)
New message  Pt verbalzied that she is calling for RN  She thinks she is allergic to the adhesive that comes with the Monitor   She want to know what Dr.Turner will advise her to do going forward  Her PCP has already given her some topical cream for the areas  Pt wants to know if there enough data that she can take it off?  Can she switch to the hand held device?

## 2018-03-19 NOTE — Telephone Encounter (Signed)
Please forward to Theodoro Kos to see if she has any suggestions

## 2018-03-19 NOTE — Telephone Encounter (Signed)
Spoke to patient she has tried 3 different versions of electrodes all have caused whelps and a rash. She has also been moving them around to give her skin a rest. Unfortunately there isnt much more she can do. She is gonna let her skin rest today and try one more time. If she breaks out again she will be mailing monitor back.

## 2018-06-03 DIAGNOSIS — Z1231 Encounter for screening mammogram for malignant neoplasm of breast: Secondary | ICD-10-CM | POA: Diagnosis not present

## 2018-06-26 ENCOUNTER — Other Ambulatory Visit: Payer: Self-pay | Admitting: Physician Assistant

## 2018-07-02 DIAGNOSIS — R0981 Nasal congestion: Secondary | ICD-10-CM | POA: Diagnosis not present

## 2018-07-02 DIAGNOSIS — R5383 Other fatigue: Secondary | ICD-10-CM | POA: Diagnosis not present

## 2018-08-22 ENCOUNTER — Other Ambulatory Visit: Payer: Self-pay | Admitting: Cardiology

## 2018-08-23 DIAGNOSIS — Z23 Encounter for immunization: Secondary | ICD-10-CM | POA: Diagnosis not present

## 2018-09-30 DIAGNOSIS — Z0000 Encounter for general adult medical examination without abnormal findings: Secondary | ICD-10-CM | POA: Diagnosis not present

## 2018-09-30 DIAGNOSIS — E7800 Pure hypercholesterolemia, unspecified: Secondary | ICD-10-CM | POA: Diagnosis not present

## 2018-09-30 DIAGNOSIS — Z79899 Other long term (current) drug therapy: Secondary | ICD-10-CM | POA: Diagnosis not present

## 2018-10-15 ENCOUNTER — Telehealth: Payer: Self-pay

## 2018-10-15 DIAGNOSIS — E7800 Pure hypercholesterolemia, unspecified: Secondary | ICD-10-CM

## 2018-10-15 MED ORDER — ROSUVASTATIN CALCIUM 20 MG PO TABS: 20.0000 mg | ORAL_TABLET | Freq: Every day | ORAL | 3 refills | Status: DC

## 2018-10-15 NOTE — Telephone Encounter (Signed)
Spoke with the patient she accepted the results and is scheduled for repeat fasting labs on 1/21.

## 2018-10-15 NOTE — Telephone Encounter (Signed)
-----   Message from Quintella Reichertraci R Turner, MD sent at 10/08/2018 12:48 PM EST ----- LDL goal is <70 70 and her LDL is 80 - please increase crestor to 20mg  daily and repeat FLP and ALT in 6 weeks

## 2018-10-31 ENCOUNTER — Other Ambulatory Visit: Payer: Self-pay | Admitting: Physician Assistant

## 2018-10-31 DIAGNOSIS — H5789 Other specified disorders of eye and adnexa: Secondary | ICD-10-CM | POA: Diagnosis not present

## 2018-10-31 DIAGNOSIS — F419 Anxiety disorder, unspecified: Secondary | ICD-10-CM | POA: Diagnosis not present

## 2018-10-31 DIAGNOSIS — R0989 Other specified symptoms and signs involving the circulatory and respiratory systems: Secondary | ICD-10-CM

## 2018-11-26 ENCOUNTER — Other Ambulatory Visit: Payer: BLUE CROSS/BLUE SHIELD

## 2018-11-27 ENCOUNTER — Other Ambulatory Visit: Payer: BLUE CROSS/BLUE SHIELD | Admitting: *Deleted

## 2018-11-27 DIAGNOSIS — E7800 Pure hypercholesterolemia, unspecified: Secondary | ICD-10-CM

## 2018-11-27 LAB — LIPID PANEL
CHOL/HDL RATIO: 1.9 ratio (ref 0.0–4.4)
Cholesterol, Total: 157 mg/dL (ref 100–199)
HDL: 84 mg/dL (ref >39)
LDL Calculated: 60 mg/dL (ref 0–99)
Triglycerides: 66 mg/dL (ref 0–149)
VLDL CHOLESTEROL CAL: 13 mg/dL (ref 5–40)

## 2018-11-27 LAB — ALT: ALT: 15 IU/L (ref 0–32)

## 2019-02-21 ENCOUNTER — Telehealth: Payer: Self-pay | Admitting: Cardiology

## 2019-02-21 NOTE — Telephone Encounter (Signed)
Video/Webex/verbal consent

## 2019-02-23 NOTE — Progress Notes (Signed)
Virtual Visit via Video Note   This visit type was conducted due to national recommendations for restrictions regarding the COVID-19 Pandemic (e.g. social distancing) in an effort to limit this patient's exposure and mitigate transmission in our community.  Due to her co-morbid illnesses, this patient is at least at moderate risk for complications without adequate follow up.  This format is felt to be most appropriate for this patient at this time.  All issues noted in this document were discussed and addressed.  A limited physical exam was performed with this format.  Please refer to the patient's chart for her consent to telehealth for [facility].  Evaluation Performed:  Follow-up visit  This visit type was conducted due to national recommendations for restrictions regarding the COVID-19 Pandemic (e.g. social distancing).  This format is felt to be most appropriate for this patient at this time.  All issues noted in this document were discussed and addressed.  No physical exam was performed (except for noted visual exam findings with Video Visits).  Please refer to the patient's chart (MyChart message for video visits and phone note for telephone visits) for the patient's consent to telehealth for [facility]  Date:  02/23/2019   ID:  Maria Cochran, DOB 12/10/1955, MRN [id number]  Patient Location:  Home  Provider location:   [place]  PCP:  Blair Heys, MD  Cardiologist:  Armanda Magic, MD Electrophysiologist:  None   Chief Complaint:  PVCs, nonsustained atrial tachycardia, ASCAD and hyperlipidemia  History of Present Illness:    Maria Cochran is a 63 y.o. female who presents via audio/video conferencing for a telehealth visit today.   Maria Cochran is a 63 y.o. female with a hx of PVC's and nonsustained atrial tachycardia, dyslipidemia and borderline obstructive ASCAD of the RPDA of 75% with no ischemia on nuclear stress test and nonobstructive elsewhere by cath on medical  management.  Event monitor showed no arrhythmias.   When I last saw her a year ago she was describing sinking spells and intermittent numbness in her arm and face.  Head CT was normal and she was referred back to her PCP for further evaluation.    She is here today for followup and is doing well.  She denies any chest pain or pressure, SOB, DOE, PND, orthopnea, LE edema, dizziness, palpitations or syncope. She is compliant with her meds and is tolerating meds with no SE.    The patient does not have symptoms concerning for COVID-19 infection (fever, chills, cough, or new shortness of breath).    Prior CV studies:   The following studies were reviewed today:  Event monitor 02/2018  Past Medical History:  Diagnosis Date  . Atrial tachycardia, paroxysmal (HCC)   . Colonic polyp   . Coronary artery disease 2016   50% prox to mid LAD, 50% ramus, 40% ostial D1, 45% distal LCX and 30% prox to mid RCA and 75% RPDA with no ischemia in inferior wall on nuclear stress test - on medical management with no angina.  . DDD (degenerative disc disease)   . Hypercholesteremia   . Hypertension   . PAC (premature atrial contraction)   . Pulmonary nodule    a. being followed by PCP, stable 07/2016.  [name] PVC's (premature ventricular contractions)   . Sinus bradycardia   . Vertigo    Past Surgical History:  Procedure Laterality Date  . CARDIAC CATHETERIZATION N/A 08/13/2015   Procedure: Left Heart Cath and Coronary Angiography;  Surgeon:  Lyn RecordsHenry W Smith, MD;  [facility];  Service: Cardiovascular;  Laterality: N/A;  . TONSILLECTOMY       No outpatient medications have been marked as taking for the 02/24/19 encounter (Appointment) with [name], Traci R, MD.     Allergies:   Sudafed [pseudoephedrine hcl]; Metoprolol; Buprenorphine hcl; Morphine and related; Allegra [fexofenadine hcl]; Doxycycline monohydrate; Lipitor [atorvastatin]; Penicillins; Sulfa antibiotics; Sulfacetamide sodium; and  Tetracyclines & related   Social History   Tobacco Use  . Smoking status: Never Smoker  . Smokeless tobacco: Never Used  Substance Use Topics  . Alcohol use: Yes    Alcohol/week: 0.0 standard drinks    Comment: occasional beer and wine  . Drug use: No     Family Hx: [name] family history includes CAD in her father; Heart disease in her father; Hypertension in her father; Parkinsonism in her mother.  ROS:   Please see the history of present illness.     All other systems reviewed and are negative.   Labs/Other Tests and Data Reviewed:    Recent Labs: 11/27/2018: ALT 15   Recent Lipid Panel Lab Results  Component Value Date/Time   CHOL 157 11/27/2018 08:45 AM   TRIG 66 11/27/2018 08:45 AM   HDL 84 11/27/2018 08:45 AM   CHOLHDL 1.9 11/27/2018 08:45 AM   CHOLHDL 1.8 05/12/2016 08:18 AM   LDLCALC 60 11/27/2018 08:45 AM    Wt Readings from Last 3 Encounters:  02/25/18 171 lb (77.6 kg)  02/13/18 171 lb (77.6 kg)  09/12/17 168 lb 7 oz (76.4 kg)     Objective:    Vital Signs:  There were no vitals taken for this visit.   CONSTITUTIONAL:  Well nourished, well developed female in no acute distress.  EYES: anicteric MOUTH: oral mucosa is pink RESPIRATORY: Normal respiratory effort, symmetric expansion CARDIOVASCULAR: No peripheral edema SKIN: No rash, lesions or ulcers MUSCULOSKELETAL: no digital cyanosis NEURO: Cranial Nerves II-XII grossly intact, moves all extremities PSYCH: Intact judgement and insight.  A&O x 3, Mood/affect appropriate   ASSESSMENT & PLAN:    1.  Nonsustained atrial tachycardia  2.  ASCAD - cath 2016 howed 50% prox to mid LAD, 50% ramus, 40% ostial D1, 45% distal LCX and 30% prox to mid RCA and 75% RPDA with no ischemia in inferior wall on nuclear stress test - on medical management.  She has not had any CP or SOB since I saw her last.  She will continue on ASA 81mg  daily, BB and statin.   3.  HTN - she checks her BP at home and it is  usually better controlled than this am.  She will continue on  Bystolic 2.5mg  daily.  4.  Hyperlipidemia - her LDL goal is < 70.  Her LDL was 60 on 11/2018.  She will continue on Crestor 20mg  daily.   COVID-19 Education: The signs and symptoms of COVID-19 were discussed with the patient and how to seek care for testing (follow up with PCP or arrange E-visit).  The importance of social distancing was discussed today.  Patient Risk:   After full review of this patient's clinical status, I feel that they are at least moderate risk at this time.  Time:   Today, I have spent 15 minutes directly with the patient on video discussing medical problems including CAD and symptoms of angina, HTN and when to call if it gets too high, hyperlipidemia, healthy diet and exercise.  We also reviewed the symptoms  of COVID 19 and the ways to protect against contracting the virus with telehealth technology.  I spent an additional 10 minutes reviewing patient's chart including reviewing prior cath from 2016, recent labs from 11/2018.  Medication Adjustments/Labs and Tests Ordered: Current medicines are reviewed at length with the patient today.  Concerns regarding medicines are outlined above.  Tests Ordered: No orders of the defined types were placed in this encounter.  Medication Changes: No orders of the defined types were placed in this encounter.   Disposition:  Follow up in 1 year(s)  Signed, Armanda Magic, MD  02/23/2019 1:18 PM    [facility]

## 2019-02-24 ENCOUNTER — Telehealth: Payer: Self-pay | Admitting: *Deleted

## 2019-02-24 ENCOUNTER — Other Ambulatory Visit: Payer: Self-pay

## 2019-02-24 ENCOUNTER — Telehealth (INDEPENDENT_AMBULATORY_CARE_PROVIDER_SITE_OTHER): Payer: BLUE CROSS/BLUE SHIELD | Admitting: Cardiology

## 2019-02-24 VITALS — BP 158/85 | HR 65 | Ht 67.0 in | Wt 166.0 lb

## 2019-02-24 DIAGNOSIS — Z7189 Other specified counseling: Secondary | ICD-10-CM | POA: Diagnosis not present

## 2019-02-24 DIAGNOSIS — I471 Supraventricular tachycardia: Secondary | ICD-10-CM

## 2019-02-24 DIAGNOSIS — I2510 Atherosclerotic heart disease of native coronary artery without angina pectoris: Secondary | ICD-10-CM | POA: Diagnosis not present

## 2019-02-24 DIAGNOSIS — I10 Essential (primary) hypertension: Secondary | ICD-10-CM

## 2019-02-24 DIAGNOSIS — E7800 Pure hypercholesterolemia, unspecified: Secondary | ICD-10-CM

## 2019-02-24 MED ORDER — NEBIVOLOL HCL 2.5 MG PO TABS: ORAL_TABLET | ORAL | 3 refills | Status: DC

## 2019-02-24 NOTE — Telephone Encounter (Signed)
PATIENT CONSENTED TO DOXIMITY    IF USING DOXIMITY or DOXY.ME - The patient will receive a link just prior to their visit, either by text or email (to be determined day of appointment depending on if it's doxy.me or Doximity)  Confirm consent - "In the setting of the current Covid19 crisis, you are scheduled for a (phone or video) visit with your provider on (date) at (time).  Just as we do with many in-office visits, in order for you to participate in this visit, we must obtain consent.  If you'd like, I can send this to your mychart (if signed up) or email for you to review.  Otherwise, I can obtain your verbal consent now.  All virtual visits are billed to your insurance company just like a normal visit would be.  By agreeing to a virtual visit, we'd like you to understand that the technology does not allow for your provider to perform an examination, and thus may limit your provider's ability to fully asess your condition. If your provider identifies any concerns that need to be evaluated in person, we will make arrangements to do so.  Finally, though the technology is pretty good, we cannot assure that it will always work on either your or our end, and in the setting of a video visit, we may have to convert it to a phone-only visit.  In either situation, we cannot ensure that we have a secure connection.  Are you willing to proceed?" STAFF: Did the patient verbally acknowledge consent to telehealth visit? Document YES/NO here: Maria LiterYES    Lylian Sanagustin M, CMA 02/24/2019 8:07 AM   .     FULL LENGTH CONSENT FOR TELE-HEALTH VISIT   I hereby voluntarily request, consent and authorize [facility] and its employed or contracted physicians, physician assistants, nurse practitioners or other licensed health care professionals (the Practitioner), to provide me with telemedicine health care services (the "Services") as deemed necessary by the treating Practitioner. I acknowledge and consent to  receive the Services by the Practitioner via telemedicine. I understand that the telemedicine visit will involve communicating with the Practitioner through live audiovisual communication technology and the disclosure of certain medical information by electronic transmission. I acknowledge that I have been given the opportunity to request an in-person assessment or other available alternative prior to the telemedicine visit and am voluntarily participating in the telemedicine visit.  I understand that I have the right to withhold or withdraw my consent to the use of telemedicine in the course of my care at any time, without affecting my right to future care or treatment, and that the Practitioner or I may terminate the telemedicine visit at any time. I understand that I have the right to inspect all information obtained and/or recorded in the course of the telemedicine visit and may receive copies of available information for a reasonable fee.  I understand that some of the potential risks of receiving the Services via telemedicine include:  [name]. Delay or interruption in medical evaluation due to technological equipment failure or disruption; . Information transmitted may not be sufficient (e.g. poor resolution of images) to allow for appropriate medical decision making by the Practitioner; and/or  . In rare instances, security protocols could fail, causing a breach of personal health information.  Furthermore, I acknowledge that it is my responsibility to provide information about my medical history, conditions and care that is complete and accurate to the best of my ability. I acknowledge that Practitioner's advice, recommendations, and/or  decision may be based on factors not within their control, such as incomplete or inaccurate data provided by me or distortions of diagnostic images or specimens that may result from electronic transmissions. I understand that the practice of medicine is not an exact science  and that Practitioner makes no warranties or guarantees regarding treatment outcomes. I acknowledge that I will receive a copy of this consent concurrently upon execution via email to the email address I last provided but may also request a printed copy by calling the office of [facility].    I understand that my insurance will be billed for this visit.   I have read or had this consent read to me. . I understand the contents of this consent, which adequately explains the benefits and risks of the Services being provided via telemedicine.  . I have been provided ample opportunity to ask questions regarding this consent and the Services and have had my questions answered to my satisfaction. . I give my informed consent for the services to be provided through the use of telemedicine in my medical care  By participating in this telemedicine visit I agree to the above.

## 2019-02-24 NOTE — Patient Instructions (Signed)

## 2019-04-21 ENCOUNTER — Other Ambulatory Visit: Payer: Self-pay | Admitting: Cardiology

## 2019-05-02 DIAGNOSIS — M25511 Pain in right shoulder: Secondary | ICD-10-CM | POA: Diagnosis not present

## 2019-05-08 DIAGNOSIS — J069 Acute upper respiratory infection, unspecified: Secondary | ICD-10-CM | POA: Diagnosis not present

## 2019-06-26 DIAGNOSIS — Z803 Family history of malignant neoplasm of breast: Secondary | ICD-10-CM | POA: Diagnosis not present

## 2019-06-26 DIAGNOSIS — Z1231 Encounter for screening mammogram for malignant neoplasm of breast: Secondary | ICD-10-CM | POA: Diagnosis not present

## 2019-07-24 DIAGNOSIS — R300 Dysuria: Secondary | ICD-10-CM | POA: Diagnosis not present

## 2019-08-30 DIAGNOSIS — Z23 Encounter for immunization: Secondary | ICD-10-CM | POA: Diagnosis not present

## 2019-10-13 ENCOUNTER — Other Ambulatory Visit: Payer: Self-pay | Admitting: Cardiology

## 2019-10-24 DIAGNOSIS — Z20828 Contact with and (suspected) exposure to other viral communicable diseases: Secondary | ICD-10-CM | POA: Diagnosis not present

## 2019-10-28 ENCOUNTER — Other Ambulatory Visit (HOSPITAL_COMMUNITY)
Admission: RE | Admit: 2019-10-28 | Discharge: 2019-10-28 | Disposition: A | Discharge status: Home / Self Care | Payer: BC Managed Care – PPO | Source: Ambulatory Visit | Attending: Family Medicine | Admitting: Family Medicine

## 2019-10-28 ENCOUNTER — Other Ambulatory Visit: Payer: Self-pay | Admitting: Family Medicine

## 2019-10-28 DIAGNOSIS — Z124 Encounter for screening for malignant neoplasm of cervix: Secondary | ICD-10-CM | POA: Diagnosis not present

## 2019-10-28 DIAGNOSIS — E7800 Pure hypercholesterolemia, unspecified: Secondary | ICD-10-CM | POA: Diagnosis not present

## 2019-10-28 DIAGNOSIS — Z79899 Other long term (current) drug therapy: Secondary | ICD-10-CM | POA: Diagnosis not present

## 2019-10-28 DIAGNOSIS — Z0000 Encounter for general adult medical examination without abnormal findings: Secondary | ICD-10-CM | POA: Diagnosis not present

## 2019-10-28 DIAGNOSIS — R3915 Urgency of urination: Secondary | ICD-10-CM | POA: Diagnosis not present

## 2019-10-29 LAB — CYTOLOGY - PAP: Diagnosis: NEGATIVE

## 2019-12-29 ENCOUNTER — Telehealth: Payer: Self-pay | Admitting: Cardiology

## 2019-12-29 NOTE — Telephone Encounter (Signed)
Patient will come in next Tuesday 03/02 to see Ronie Spies, PA-C

## 2019-12-29 NOTE — Telephone Encounter (Signed)
Patient states that for the last few weeks she has been having what she calls "sinking spells". She states that she gets lightheaded for a short period of time and feels like her blood pressure is dropping. She denies any passing out, dizziness, chest pain,or shortness of breath. She states that it comes on randomly, it has happened when she was standing at the kitchen sink and a couple times when she was sitting at her desk. She took her BP this morning which was 147/74. She states that she has not been taking her BP regularly which I advised her to do. She states that she is staying hydrated. She also reports achy pain on the left side of her body.  She has had these spells in the past.

## 2019-12-29 NOTE — Telephone Encounter (Signed)
Please get her in with an extender this week for orthostatic BPs.  Also get a 3o day event monitor

## 2019-12-29 NOTE — Telephone Encounter (Signed)
  STAT if patient feels like he/she is going to faint   1) Are you dizzy now? no  2) Do you feel faint or have you passed out? no  3) Do you have any other symptoms? Pain on left side  4) Have you checked your HR and BP (record if available)? 147/74 HR 61  Patient states she has been having sinking spells, where she feels like she may faint. She states it does not happen all the time and she has not passed out, but believes her BP may be getting low. She has an appointment 3/29.

## 2020-01-05 NOTE — Progress Notes (Signed)
Cardiology Office Note    Date:  01/06/2020   ID:  Maria Cochran, DOB 1956-03-15, MRN [id number]  PCP:  Gaynelle Arabian, MD  Cardiologist:  Fransico Him, MD  Electrophysiologist:  None   Chief Complaint: sinking spells  History of Present Illness:   Maria Cochran is a 64 y.o. female with history of PVCs, PACs, nonsustained atrial tachycardia, sinus bradycardia (baseline HR 60s), dyslipidemia, borderline CAD in 2016 (RPDA of 75% with nonobstructive disease elsewhere, treated medically), HTN, vertigo who presents for evaluation of sinking spells.  Prior cardiac studies include event monitor 05/2015 with PACs, PVCs, nonsustained atrial tach up to 10 beats. LHC 08/13/15 showed 50% ramus, 50% prox-mid LAD, 40% D1, 45% Cx, 30% prox-mid RCA, 75% RPDA. Nuclear stress test 07/13/15 had shown prior MI with peri-infarct ischemia, EF 75%, so she was managed medically. 2D echo 09/20/16 showed EF 60-65%, no RWMA, grade 2 DD, mildly calcified AV with mild AI, mild MR, mild LAE. I met her in 2017 when she was reporting increased palpitations. She was intolerant of metoprolol so has been managed with Bystolic. She also has a history of sinking spells and intermittent numbness in her arm and face. Per Dr. Theodosia Blender note, she had apparently had this in the past with left foot tingling and was referred to neurology but no diagnosis made. Repeat monitor 02/2018 showed no arrhythmias although she does not think she had any episodes while wearing the monitor. Exercise nuclear stress test 02/2018 was normal. Head CT was normal and she was referred back to her PCP for further evaluation. Last labs scanned 10/2019 personally reviewed showed Cr 0.79, K 4.2, normal LFTs, LDL 78, no recent CBC.  She returns for repeat evaluation of these unusual spells. She recalls they first began a few years back around the time she was found to have heart palpitations requiring medication adjustment. She can go periods of time without this sensation  although has noticed it a little more frequently recently, prompting a call into our office. She was not seen last year due to the pandemic. She has had about a dozen or more in the past 4 weeks. She states when it happens, it feels as though she has a lot of stress built up in her body and then it just releases. It lasts only a quick few seconds. No associated CP, SOB, palpitations, specific neurologic symptoms, seizure activity or post-ictal state. She recently checked her blood pressure around the time of events and noticed it tended to run around 573-220 systolic. It is not possible to capture the BP acutely at the time of event because it's so fleeting. She has not seen any low readings. It is not specifically positional and can happen just sitting at her desk. She also has noticed an intermittent left flank ache that can happen for days, nonexertional. She does yoga with possible improvement. She is able to walk with her partner 32 minutes/3 miles at a time without angina or dyspnea. She feels tired at the end of the walk but no acute changes from prior. She has not had any recent syncope but does recall 2 nights a few weeks ago where she felt lightheaded upon standing in the middle of the night to get up to go to the bathroom.  Orthostatic VS as follows: Lying HR 57 BP 130/80 Sitting HR 59 BP 132/78 Standing 28mHR 70  BP 117/79 Standing 341mR 66 BP 119/76  Past Medical History:  Diagnosis Date  .  Atrial tachycardia, paroxysmal ([place])   . Colonic polyp   . Coronary artery disease 2016   50% prox to mid LAD, 50% ramus, 40% ostial D1, 45% distal LCX and 30% prox to mid RCA and 75% RPDA with no ischemia in inferior wall on nuclear stress test - on medical management with no angina.  . DDD (degenerative disc disease)   . Hypercholesteremia   . Hypertension   . PAC (premature atrial contraction)   . Pulmonary nodule    a. being followed by PCP, stable 07/2016.  [name] PVC's (premature ventricular  contractions)   . Sinus bradycardia   . Vertigo     Past Surgical History:  Procedure Laterality Date  . CARDIAC CATHETERIZATION N/A 08/13/2015   Procedure: Left Heart Cath and Coronary Angiography;  Surgeon: Belva Crome, MD;  Location: [facility];  Service: Cardiovascular;  Laterality: N/A;  . TONSILLECTOMY      Current Medications: Current Meds  Medication Sig  . aspirin 81 MG tablet Take 81 mg by mouth daily.  . Coenzyme Q10 (COQ10 PO) Take 1 capsule by mouth daily.   [name] loratadine (CLARITIN) 10 MG tablet Take 10 mg by mouth daily as needed for allergies.   [name] nebivolol (BYSTOLIC) 2.5 MG tablet TAKE 1 TABLET DAILY AND MAY TAKE 1 EXTRA TABLET AS NEEDED FOR PALPITATIONS DAILY.  [name] NITROSTAT 0.4 MG SL tablet PLACE 1 TABLET UNDER THE TONGUE EVERY 5 MINUTES AS NEEDED FOR CHEST PAIN, MAY REPEAT FOR 3 DOSES.  Vladimir Faster Glycol-Propyl Glycol (SYSTANE OP) Place 2 drops into both eyes as needed (for dry eyes).  . rosuvastatin (CRESTOR) 20 MG tablet TAKE 1 TABLET ONCE DAILY.      Allergies:   Sudafed [pseudoephedrine hcl], Metoprolol, Buprenorphine hcl, Morphine and related, Allegra [fexofenadine hcl], Doxycycline monohydrate, Lipitor [atorvastatin], Penicillins, Sulfa antibiotics, Sulfacetamide sodium, and Tetracyclines & related   Social History   Socioeconomic History  . Marital status: Single    Spouse name: Not on file  . Number of children: 0  . Years of education: 31  . Highest education level: Not on file  Occupational History  . Occupation: Geophysicist/field seismologist  Tobacco Use  . Smoking status: Never Smoker  . Smokeless tobacco: Never Used  Substance and Sexual Activity  . Alcohol use: Yes    Alcohol/week: 0.0 standard drinks    Comment: occasional beer and wine  . Drug use: No  . Sexual activity: Not on file  Other Topics Concern  . Not on file  Social History Narrative   Lives with partner in a one story home.  Has no children.  Works as a Geophysicist/field seismologist.  Education:  college.    Social Determinants of Health   Financial Resource Strain:   . Difficulty of Paying Living Expenses: Not on file  Food Insecurity:   . Worried About Charity fundraiser in the Last Year: Not on file  . Ran Out of Food in the Last Year: Not on file  Transportation Needs:   . Lack of Transportation (Medical): Not on file  . Lack of Transportation (Non-Medical): Not on file  Physical Activity:   . Days of Exercise per Week: Not on file  . Minutes of Exercise per Session: Not on file  Stress:   . Feeling of Stress : Not on file  Social Connections:   . Frequency of Communication with Friends and Family: Not on file  . Frequency of Social Gatherings with Friends and Family: Not on file  .  Attends Religious Services: Not on file  . Active Member of Clubs or Organizations: Not on file  . Attends Archivist Meetings: Not on file  . Marital Status: Not on file     Family History:  [name] family history includes CAD in her father; Heart disease in her father; Hypertension in her father; Parkinsonism in her mother.  ROS:   Please see the history of present illness.  All other systems are reviewed and otherwise negative.    EKGs/Labs/Other Studies Reviewed:    Studies reviewed are outlined and summarized above. Reports included below if pertinent.  2D echo 09/2016 - Left ventricle: The cavity size was normal. Systolic function was  normal. The estimated ejection fraction was in the range of 60%  to 65%. Wall motion was normal; there were no regional wall  motion abnormalities. Features are consistent with a pseudonormal  left ventricular filling pattern, with concomitant abnormal  relaxation and increased filling pressure (grade 2 diastolic  dysfunction).  - Aortic valve: Trileaflet; mildly thickened, mildly calcified  leaflets. There was mild regurgitation.  - Mitral valve: There was mild regurgitation.  - Left atrium: The atrium was  mildly dilated.  - Pulmonic valve: There was trivial regurgitation.   NST 02/2018  Nuclear stress EF: 68%. The left ventricular ejection fraction is hyperdynamic (>65%).  Patient walked on a Bruce protocol treadmill test for a total of 9 minutes and 3 seconds. She achieved a peak heart rate of 157 which is 99% predicted maximal heart rate.  At peak exercise there were no ST or T wave changes to suggest ischemia. Her blood pressure response to exercise was hypertensive.  The study is normal.  This is a low risk study.   Monitor 02/2018  Normal sinus rhythm with heart rate ranging from 60-100bpm    EKG:  EKG is ordered today, personally reviewed, demonstrating NSR 60bpm, no acute STT changes  Recent Labs: No results found for requested labs within last 8760 hours.  Recent Lipid Panel    Component Value Date/Time   CHOL 157 11/27/2018 0845   TRIG 66 11/27/2018 0845   HDL 84 11/27/2018 0845   CHOLHDL 1.9 11/27/2018 0845   CHOLHDL 1.8 05/12/2016 0818   VLDL 11 05/12/2016 0818   LDLCALC 60 11/27/2018 0845    PHYSICAL EXAM:    VS:  BP 132/78   Pulse 60   Ht _0  (1.702 m)   Wt 174 lb (78.9 kg)   SpO2 98%   BMI 27.25 kg/m   BMI: Body mass index is 27.25 kg/m.  GEN: Well nourished, well developed WF, in no acute distress HEENT: normocephalic, atraumatic Neck: no JVD, carotid bruits, or masses Cardiac: RRR; no murmurs, rubs, or gallops, no edema  Respiratory:  clear to auscultation bilaterally, normal work of breathing GI: soft, nontender, nondistended, + BS MS: no deformity or atrophy Skin: warm and dry, no rash Neuro:  Alert and Oriented x 3, Strength and sensation are intact, follows commands Psych: euthymic mood, full affect  Wt Readings from Last 3 Encounters:  01/06/20 174 lb (78.9 kg)  02/24/19 166 lb (75.3 kg)  02/25/18 171 lb (77.6 kg)     ASSESSMENT & PLAN:   1. Dizziness/sinking feeling - she has history of sensitivity to the monitor stickers in the  past so I do not think she would tolerate a 30 day monitor again. We will try a 14 day Zio to see if we can correlate with any specific arrhythmias.  She recalls this began a few years ago around the time she required medication adjustment for her ectopy. I wonder if she is sensing her ectopy or atrial tach in a peculiar way, perhaps a compensatory pause that could be causing her symptoms. Will also update her echocardiogram to ensure no structural abnormalities. It does not seem at this time that her blood pressure is acutely the culprit. We can certainly try a trial off nebivolol in the future but would prefer to capture monitor in its current state to see if we can diagnose the problem before making changes. Will also update CBC, Mg, TSH with labs as these have not been obtained recently. Of note, orthostatics in the office today are negative. No specific physical exam findings including no carotid bruits or murmurs. 2. History of PACs/PVCS and atrial tachycardia - has not felt significant palpitations lately. Continue plan as above. Update labs.  3. Essential HTN - BP upper limits of normal, at times hypertensive after brief events. Would not make any changes at this time, cognizant of tendency for orthostasis. 4. Nonobstructive CAD - no recent progressive anginal symptoms. She has an intermittent flank aching that is not specifically exertional. She is able to walk for 3 miles without anginal symptoms. Continue ASA, BB, statin for now. Lipids are followed by primary care. Her LDL was recently 55. I do not want to make any acute medicine changes today so as not to confuse the picture but if she is clinically stable in follow-up, consider statin titration to achieve a goal LDL of <70.  Disposition: F/u with Dr. Radford Pax as scheduled later this month (01/2020).  Medication Adjustments/Labs and Tests Ordered: Current medicines are reviewed at length with the patient today.  Concerns regarding medicines are  outlined above. Medication changes, Labs and Tests ordered today are summarized above and listed in the Patient Instructions accessible in Encounters.   Signed, Charlie Pitter, PA-C  01/06/2020 9:47 AM    [address], [place], [place]  [address] Phone: [phone]; Fax: [phone]

## 2020-01-06 ENCOUNTER — Telehealth: Payer: Self-pay | Admitting: Radiology

## 2020-01-06 ENCOUNTER — Encounter: Payer: Self-pay | Admitting: Physician Assistant

## 2020-01-06 ENCOUNTER — Other Ambulatory Visit: Payer: Self-pay

## 2020-01-06 ENCOUNTER — Ambulatory Visit: Payer: BC Managed Care – PPO | Admitting: Physician Assistant

## 2020-01-06 VITALS — BP 132/78 | HR 60 | Ht 67.0 in | Wt 174.0 lb

## 2020-01-06 DIAGNOSIS — I493 Ventricular premature depolarization: Secondary | ICD-10-CM

## 2020-01-06 DIAGNOSIS — I4719 Other supraventricular tachycardia: Secondary | ICD-10-CM

## 2020-01-06 DIAGNOSIS — I491 Atrial premature depolarization: Secondary | ICD-10-CM | POA: Diagnosis not present

## 2020-01-06 DIAGNOSIS — I2510 Atherosclerotic heart disease of native coronary artery without angina pectoris: Secondary | ICD-10-CM

## 2020-01-06 DIAGNOSIS — I471 Supraventricular tachycardia: Secondary | ICD-10-CM | POA: Diagnosis not present

## 2020-01-06 DIAGNOSIS — R42 Dizziness and giddiness: Secondary | ICD-10-CM

## 2020-01-06 DIAGNOSIS — I10 Essential (primary) hypertension: Secondary | ICD-10-CM

## 2020-01-06 LAB — CBC
Hematocrit: 41.8 % (ref 34.0–46.6)
Hemoglobin: 14.6 g/dL (ref 11.1–15.9)
MCH: 31.7 pg (ref 26.6–33.0)
MCHC: 34.9 g/dL (ref 31.5–35.7)
MCV: 91 fL (ref 79–97)
Platelets: 199 10*3/uL (ref 150–450)
RBC: 4.61 x10E6/uL (ref 3.77–5.28)
RDW: 12.7 % (ref 11.7–15.4)
WBC: 5.3 10*3/uL (ref 3.4–10.8)

## 2020-01-06 LAB — TSH: TSH: 2.69 u[IU]/mL (ref 0.450–4.500)

## 2020-01-06 LAB — MAGNESIUM: Magnesium: 2.1 mg/dL (ref 1.6–2.3)

## 2020-01-06 MED ORDER — NEBIVOLOL HCL 2.5 MG PO TABS: ORAL_TABLET | ORAL | 3 refills | Status: DC

## 2020-01-06 NOTE — Addendum Note (Signed)
Addended by: Burnetta Sabin on: 01/06/2020 11:10 AM   Modules accepted: Orders

## 2020-01-06 NOTE — Telephone Encounter (Signed)
Monitor was enrolled and J. Witty gave monitor to patient while she was in office for her appt. Patient will apply after echo on Thursday.

## 2020-01-06 NOTE — Patient Instructions (Addendum)
Medication Instructions:  Your physician recommends that you continue on your current medications as directed. Please refer to the Current Medication list given to you today.  *If you need a refill on your cardiac medications before your next appointment, please call your pharmacy*   Lab Work: TODAY:  MAG, CBC, & TSH  If you have labs (blood work) drawn today and your tests are completely normal, you will receive your results only by: [name] MyChart Message (if you have MyChart) OR . A paper copy in the mail If you have any lab test that is abnormal or we need to change your treatment, we will call you to review the results.   Testing/Procedures: Your physician has requested that you have an echocardiogram. 01/08/2020 ARRIVE AT 3:55 FOR THIS TEST.  Echocardiography is a painless test that uses sound waves to create images of your heart. It provides your doctor with information about the size and shape of your heart and how well your heart's chambers and valves are working. This procedure takes approximately one hour. There are no restrictions for this procedure.   ZIO XT- Long Term Monitor Instructions   Your physician has requested you wear your ZIO patch monitor 30 days.   This is a single patch monitor.  Irhythm supplies one patch monitor per enrollment.  Additional stickers are not available.   Please do not apply patch if you will be having a Nuclear Stress Test, Echocardiogram, Cardiac CT, MRI, or Chest Xray during the time frame you would be wearing the monitor. The patch cannot be worn during these tests.  You cannot remove and re-apply the ZIO XT patch monitor.    Once you have received you monitor, please review enclosed instructions.  Your monitor has already been registered assigning a specific monitor serial # to you.   Applying the monitor   Shave hair from upper left chest.   Hold abrader disc by orange tab.  Rub abrader in 40 strokes over left upper chest as indicated in  your monitor instructions.   Clean area with 4 enclosed alcohol pads .  Use all pads to assure are is cleaned thoroughly.  Let dry.   Apply patch as indicated in monitor instructions.  Patch will be place under collarbone on left side of chest with arrow pointing upward.   Rub patch adhesive wings for 2 minutes.Remove white label marked "1".  Remove white label marked "2".  Rub patch adhesive wings for 2 additional minutes.   While looking in a mirror, press and release button in center of patch.  A small green light will flash 3-4 times .  This will be your only indicator the monitor has been turned on.     Do not shower for the first 24 hours.  You may shower after the first 24 hours.   Press button if you feel a symptom. You will hear a small click.  Record Date, Time and Symptom in the Patient Log Book.   When you are ready to remove patch, follow instructions on last 2 pages of Patient Log Book.  Stick patch monitor onto last page of Patient Log Book.   Place Patient Log Book in [place] box.  Use locking tab on box and tape box closed securely.  The Orange and [employer] has [employer] on it.  Please place in mailbox as soon as possible.  Your physician should have your test results approximately 7 days after the monitor has been mailed back to [facility].  Call [facility] Customer Care at [phone] if you have questions regarding your ZIO XT patch monitor.  Call them immediately if you see an orange light blinking on your monitor.   If your monitor falls off in less than 4 days contact our Monitor department at [phone].  If your monitor becomes loose or falls off after 4 days call [employer] at [phone] for suggestions on securing your monitor.     Follow-Up: At [facility], you and your health needs are our priority.  As part of our continuing mission to provide you with exceptional heart care, we have created designated Provider Care Teams.  These Care Teams  include your primary Cardiologist (physician) and Advanced Practice Providers (APPs -  Physician Assistants and Nurse Practitioners) who all work together to provide you with the care you need, when you need it.  We recommend signing up for the patient portal called "MyChart".  Sign up information is provided on this After Visit Summary.  MyChart is used to connect with patients for Virtual Visits (Telemedicine).  Patients are able to view lab/test results, encounter notes, upcoming appointments, etc.  Non-urgent messages can be sent to your provider as well.   To learn more about what you can do with MyChart, go to [email or web address].    Your next appointment:   Keep your scheduled appointment with Dr. Mayford Knife 02/02/2020.   Other Instructions  Echocardiogram An echocardiogram is a procedure that uses painless sound waves (ultrasound) to produce an image of the heart. Images from an echocardiogram can provide important information about:  Signs of coronary artery disease (CAD).  Aneurysm detection. An aneurysm is a weak or damaged part of an artery wall that bulges out from the normal force of blood pumping through the body.  Heart size and shape. Changes in the size or shape of the heart can be associated with certain conditions, including heart failure, aneurysm, and CAD.  Heart muscle function.  Heart valve function.  Signs of a past heart attack.  Fluid buildup around the heart.  Thickening of the heart muscle.  A tumor or infectious growth around the heart valves. Tell a health care provider about:  Any allergies you have.  All medicines you are taking, including vitamins, herbs, eye drops, creams, and over-the-counter medicines.  Any blood disorders you have.  Any surgeries you have had.  Any medical conditions you have.  Whether you are pregnant or may be pregnant. What are the risks? Generally, this is a safe procedure. However, problems may occur,  including:  Allergic reaction to dye (contrast) that may be used during the procedure. What happens before the procedure? No specific preparation is needed. You may eat and drink normally. What happens during the procedure?   An IV tube may be inserted into one of your veins.  You may receive contrast through this tube. A contrast is an injection that improves the quality of the pictures from your heart.  A gel will be applied to your chest.  A wand-like tool (transducer) will be moved over your chest. The gel will help to transmit the sound waves from the transducer.  The sound waves will harmlessly bounce off of your heart to allow the heart images to be captured in real-time motion. The images will be recorded on a computer. The procedure may vary among health care providers and hospitals. What happens after the procedure?  You may return to your normal, everyday life, including diet, activities, and medicines, unless your health care  provider tells you not to do that. Summary  An echocardiogram is a procedure that uses painless sound waves (ultrasound) to produce an image of the heart.  Images from an echocardiogram can provide important information about the size and shape of your heart, heart muscle function, heart valve function, and fluid buildup around your heart.  You do not need to do anything to prepare before this procedure. You may eat and drink normally.  After the echocardiogram is completed, you may return to your normal, everyday life, unless your health care provider tells you not to do that. This information is not intended to replace advice given to you by your health care provider. Make sure you discuss any questions you have with your health care provider. Document Revised: 02/13/2019 Document Reviewed: 11/25/2016 Elsevier Patient Education  [place].

## 2020-01-08 ENCOUNTER — Other Ambulatory Visit: Payer: Self-pay

## 2020-01-08 ENCOUNTER — Ambulatory Visit (HOSPITAL_COMMUNITY): Payer: BC Managed Care – PPO | Attending: Cardiology

## 2020-01-08 DIAGNOSIS — I491 Atrial premature depolarization: Secondary | ICD-10-CM | POA: Insufficient documentation

## 2020-01-08 DIAGNOSIS — R42 Dizziness and giddiness: Secondary | ICD-10-CM | POA: Diagnosis not present

## 2020-01-10 ENCOUNTER — Other Ambulatory Visit (INDEPENDENT_AMBULATORY_CARE_PROVIDER_SITE_OTHER): Payer: BC Managed Care – PPO

## 2020-01-10 DIAGNOSIS — I471 Supraventricular tachycardia: Secondary | ICD-10-CM

## 2020-01-10 DIAGNOSIS — I493 Ventricular premature depolarization: Secondary | ICD-10-CM | POA: Diagnosis not present

## 2020-01-10 DIAGNOSIS — R42 Dizziness and giddiness: Secondary | ICD-10-CM

## 2020-01-10 DIAGNOSIS — I491 Atrial premature depolarization: Secondary | ICD-10-CM

## 2020-01-10 DIAGNOSIS — I10 Essential (primary) hypertension: Secondary | ICD-10-CM

## 2020-01-10 DIAGNOSIS — I2510 Atherosclerotic heart disease of native coronary artery without angina pectoris: Secondary | ICD-10-CM

## 2020-01-11 ENCOUNTER — Ambulatory Visit: Payer: BC Managed Care – PPO | Attending: Internal Medicine

## 2020-01-11 DIAGNOSIS — Z23 Encounter for immunization: Secondary | ICD-10-CM | POA: Insufficient documentation

## 2020-01-11 NOTE — Progress Notes (Signed)
   Covid-19 Vaccination Clinic  Name:  PAMELYN BANCROFT    MRN: [id number] DOB: [birth year]  01/11/2020  Ms. Puleo was observed post Covid-19 immunization for 15 minutes without incident. She was provided with Vaccine Information Sheet and instruction to access the V-Safe system.   Ms. Weddington was instructed to call 911 with any severe reactions post vaccine: [name] Difficulty breathing  . Swelling of face and throat  . A fast heartbeat  . A bad rash all over body  . Dizziness and weakness   Immunizations Administered    Name Date Dose VIS Date Route   Pfizer COVID-19 Vaccine 01/11/2020 12:36 PM 0.3 mL 10/17/2019 Intramuscular   Manufacturer: [employer], [employer]   Lot: [id number]   NDC: [address]-8

## 2020-01-13 ENCOUNTER — Other Ambulatory Visit: Payer: Self-pay | Admitting: Cardiology

## 2020-01-29 DIAGNOSIS — I471 Supraventricular tachycardia: Secondary | ICD-10-CM | POA: Diagnosis not present

## 2020-01-29 DIAGNOSIS — R42 Dizziness and giddiness: Secondary | ICD-10-CM | POA: Diagnosis not present

## 2020-01-29 DIAGNOSIS — I493 Ventricular premature depolarization: Secondary | ICD-10-CM | POA: Diagnosis not present

## 2020-02-02 ENCOUNTER — Ambulatory Visit: Payer: BC Managed Care – PPO | Admitting: Cardiology

## 2020-02-02 ENCOUNTER — Other Ambulatory Visit: Payer: Self-pay

## 2020-02-02 ENCOUNTER — Encounter: Payer: Self-pay | Admitting: Cardiology

## 2020-02-02 VITALS — BP 136/74 | HR 64 | Ht 67.0 in | Wt 176.2 lb

## 2020-02-02 DIAGNOSIS — I10 Essential (primary) hypertension: Secondary | ICD-10-CM | POA: Diagnosis not present

## 2020-02-02 DIAGNOSIS — E7800 Pure hypercholesterolemia, unspecified: Secondary | ICD-10-CM | POA: Diagnosis not present

## 2020-02-02 DIAGNOSIS — I2510 Atherosclerotic heart disease of native coronary artery without angina pectoris: Secondary | ICD-10-CM

## 2020-02-02 DIAGNOSIS — I471 Supraventricular tachycardia: Secondary | ICD-10-CM

## 2020-02-02 NOTE — Patient Instructions (Signed)
Medication Instructions:  Your physician recommends that you continue on your current medications as directed. Please refer to the Current Medication list given to you today.  *If you need a refill on your cardiac medications before your next appointment, please call your pharmacy*   Lab Work: ALT and fasting lipids in 4 months.  If you have labs (blood work) drawn today and your tests are completely normal, you will receive your results only by: [name] MyChart Message (if you have MyChart) OR . A paper copy in the mail If you have any lab test that is abnormal or we need to change your treatment, we will call you to review the results.   Follow-Up: At [facility], you and your health needs are our priority.  As part of our continuing mission to provide you with exceptional heart care, we have created designated Provider Care Teams.  These Care Teams include your primary Cardiologist (physician) and Advanced Practice Providers (APPs -  Physician Assistants and Nurse Practitioners) who all work together to provide you with the care you need, when you need it.  Your next appointment:   1 year(s)  The format for your next appointment:   In Person  Provider:   Armanda Magic, MD

## 2020-02-02 NOTE — Progress Notes (Signed)
Cardiology Office Note:    Date:  02/02/2020   ID:  Maria Cochran, DOB 1955-11-24, MRN [id number]  PCP:  Blair Heys, MD  Cardiologist:  Armanda Magic, MD    Referring MD: Blair Heys, MD   Chief Complaint  Patient presents with  . Coronary Artery Disease  . Hypertension  . Hyperlipidemia    History of Present Illness:    Maria Cochran is a 64 y.o. female with a hx of PVC's and nonsustained atrial tachycardia, dyslipidemia and borderline obstructive ASCAD of the RPDA of 75% with no ischemia on nuclear stress test and nonobstructive elsewhere by cath on medical management.Event monitor showed no arrhythmias.   She is here today for followup and is doing well.  She denies any chest pain or pressure, SOB, DOE, PND, orthopnea, LE edema, dizziness or syncope. She does occasionally have some mild palpitations but not really bothersome.  She is compliant with her meds and is tolerating meds with no SE.    Past Medical History:  Diagnosis Date  . Atrial tachycardia, paroxysmal (HCC)   . Colonic polyp   . Coronary artery disease 2016   50% prox to mid LAD, 50% ramus, 40% ostial D1, 45% distal LCX and 30% prox to mid RCA and 75% RPDA with no ischemia in inferior wall on nuclear stress test - on medical management with no angina.  . DDD (degenerative disc disease)   . Hypercholesteremia   . Hypertension   . PAC (premature atrial contraction)   . Pulmonary nodule    a. being followed by PCP, stable 07/2016.  [name] PVC's (premature ventricular contractions)   . Sinus bradycardia   . Vertigo     Past Surgical History:  Procedure Laterality Date  . CARDIAC CATHETERIZATION N/A 08/13/2015   Procedure: Left Heart Cath and Coronary Angiography;  Surgeon: Lyn Records, MD;  Location: [facility] INVASIVE CV LAB;  Service: Cardiovascular;  Laterality: N/A;  . TONSILLECTOMY      Current Medications: Current Meds  Medication Sig  . aspirin 81 MG tablet Take 81 mg by mouth daily.  . Coenzyme Q10  (COQ10 PO) Take 1 capsule by mouth daily.   [name] loratadine (CLARITIN) 10 MG tablet Take 10 mg by mouth daily as needed for allergies.   [name] nebivolol (BYSTOLIC) 2.5 MG tablet TAKE 1 TABLET DAILY AND MAY TAKE 1 EXTRA TABLET AS NEEDED FOR PALPITATIONS DAILY.  [name] NITROSTAT 0.4 MG SL tablet PLACE 1 TABLET UNDER THE TONGUE EVERY 5 MINUTES AS NEEDED FOR CHEST PAIN, MAY REPEAT FOR 3 DOSES.  [name] Glycol-Propyl Glycol (SYSTANE OP) Place 2 drops into both eyes as needed (for dry eyes).  . rosuvastatin (CRESTOR) 20 MG tablet TAKE 1 TABLET ONCE DAILY.     Allergies:   Sudafed [pseudoephedrine hcl], Metoprolol, Buprenorphine hcl, Morphine and related, Allegra [fexofenadine hcl], Doxycycline monohydrate, Lipitor [atorvastatin], Penicillins, Sulfa antibiotics, Sulfacetamide sodium, and Tetracyclines & related   Social History   Socioeconomic History  . Marital status: Single    Spouse name: Not on file  . Number of children: 0  . Years of education: 19  . Highest education level: Not on file  Occupational History  . Occupation: Environmental manager  Tobacco Use  . Smoking status: Never Smoker  . Smokeless tobacco: Never Used  Substance and Sexual Activity  . Alcohol use: Yes    Alcohol/week: 0.0 standard drinks    Comment: occasional beer and wine  . Drug use: No  . Sexual activity: Not on  file  Other Topics Concern  . Not on file  Social History Narrative   Lives with partner in a one story home.  Has no children.  Works as a Geophysicist/field seismologist.  Education: college.    Social Determinants of Health   Financial Resource Strain:   . Difficulty of Paying Living Expenses:   Food Insecurity:   . Worried About Charity fundraiser in the Last Year:   . Arboriculturist in the Last Year:   Transportation Needs:   . Film/video editor (Medical):   [name] Lack of Transportation (Non-Medical):   Physical Activity:   . Days of Exercise per Week:   . Minutes of Exercise per Session:   Stress:   . Feeling of  Stress :   Social Connections:   . Frequency of Communication with Friends and Family:   . Frequency of Social Gatherings with Friends and Family:   . Attends Religious Services:   . Active Member of Clubs or Organizations:   . Attends Archivist Meetings:   [name] Marital Status:      Family History: [name] family history includes CAD in her father; Heart disease in her father; Hypertension in her father; Parkinsonism in her mother.  ROS:   Please see the history of present illness.    ROS  All other systems reviewed and negative.   EKGs/Labs/Other Studies Reviewed:    The following studies were reviewed today: none  EKG:  EKG is not ordered today.    Recent Labs: 01/06/2020: Hemoglobin 14.6; Magnesium 2.1; Platelets 199; TSH 2.690   Recent Lipid Panel    Component Value Date/Time   CHOL 157 11/27/2018 0845   TRIG 66 11/27/2018 0845   HDL 84 11/27/2018 0845   CHOLHDL 1.9 11/27/2018 0845   CHOLHDL 1.8 05/12/2016 0818   VLDL 11 05/12/2016 0818   LDLCALC 60 11/27/2018 0845    Physical Exam:    VS:  BP 136/74 (BP Location: Right Arm)   Pulse 64   Ht 5\' 7"  (1.702 m)   Wt 176 lb 3.2 oz (79.9 kg)   BMI 27.60 kg/m     Wt Readings from Last 3 Encounters:  02/02/20 176 lb 3.2 oz (79.9 kg)  01/06/20 174 lb (78.9 kg)  02/24/19 166 lb (75.3 kg)     GEN:  Well nourished, well developed in no acute distress HEENT: Normal NECK: No JVD; No carotid bruits LYMPHATICS: No lymphadenopathy CARDIAC: RRR, no murmurs, rubs, gallops RESPIRATORY:  Clear to auscultation without rales, wheezing or rhonchi  ABDOMEN: Soft, non-tender, non-distended MUSCULOSKELETAL:  No edema; No deformity  SKIN: Warm and dry NEUROLOGIC:  Alert and oriented x 3 PSYCHIATRIC:  Normal affect   ASSESSMENT:    1. Atrial tachycardia, paroxysmal ([place])   2. Coronary artery disease involving native coronary artery of native heart without angina pectoris   3. Essential hypertension   4. Pure  hypercholesterolemia    PLAN:    In order of problems listed above:  1.  Nonsustained atrial tachycardia -she occasionally has some mild palpitation but well tolerated -continue Bystolic 2.5mg  daily  2.  ASCAD  -cath 2016 howed 50% prox to mid LAD, 50% ramus, 40% ostial D1, 45% distal LCX and 30% prox to mid RCA and 75% RPDA with no ischemia in inferior wall on nuclear stress test - on medical management.   -she has not had any anginal sx since I saw her last -continue ASA 81mg  daily, Bystolic and  statin  3.  HTN  -Bp well controlled -continue Bystolic 2.5mg  daily  4.  Hyperlipidemia  - her LDL goal is < 70.   - her last LDL was 78 in Dec 2020 - she wants to try diet and exercise and then repeat FLP in 4 months - continue Crestor 20mg  daily   Medication Adjustments/Labs and Tests Ordered: Current medicines are reviewed at length with the patient today.  Concerns regarding medicines are outlined above.  Orders Placed This Encounter  Procedures  . ALT  . Lipid panel   No orders of the defined types were placed in this encounter.   Signed, , MD  02/02/2020 9:03 AM    [facility]

## 2020-02-10 ENCOUNTER — Ambulatory Visit: Payer: BC Managed Care – PPO | Attending: Internal Medicine

## 2020-02-10 DIAGNOSIS — Z23 Encounter for immunization: Secondary | ICD-10-CM

## 2020-02-10 NOTE — Progress Notes (Signed)
   Covid-19 Vaccination Clinic  Name:  Maria Cochran    MRN: [id number] DOB: 06/25/1956  02/10/2020  [name] was observed post Covid-19 immunization for 15 minutes without incident. She was provided with Vaccine Information Sheet and instruction to access the V-Safe system.   [name] was instructed to call 911 with any severe reactions post vaccine: [name] Difficulty breathing  . Swelling of face and throat  . A fast heartbeat  . A bad rash all over body  . Dizziness and weakness   Immunizations Administered    Name Date Dose VIS Date Route   Pfizer COVID-19 Vaccine 02/10/2020 12:11 PM 0.3 mL 10/17/2019 Intramuscular   Manufacturer: [employer], [employer]   Lot: [id number]   NDC: [address]-3

## 2020-06-04 ENCOUNTER — Other Ambulatory Visit: Payer: Self-pay

## 2020-06-04 ENCOUNTER — Other Ambulatory Visit: Payer: BC Managed Care – PPO | Admitting: *Deleted

## 2020-06-04 DIAGNOSIS — I10 Essential (primary) hypertension: Secondary | ICD-10-CM | POA: Diagnosis not present

## 2020-06-04 DIAGNOSIS — E7800 Pure hypercholesterolemia, unspecified: Secondary | ICD-10-CM

## 2020-06-04 LAB — LIPID PANEL
Chol/HDL Ratio: 1.9 ratio (ref 0.0–4.4)
Cholesterol, Total: 139 mg/dL (ref 100–199)
HDL: 72 mg/dL (ref >39)
LDL Chol Calc (NIH): 55 mg/dL (ref 0–99)
Triglycerides: 55 mg/dL (ref 0–149)
VLDL Cholesterol Cal: 12 mg/dL (ref 5–40)

## 2020-06-04 LAB — ALT: ALT: 19 IU/L (ref 0–32)

## 2020-06-09 DIAGNOSIS — H18593 Other hereditary corneal dystrophies, bilateral: Secondary | ICD-10-CM | POA: Diagnosis not present

## 2020-06-21 DIAGNOSIS — Z0001 Encounter for general adult medical examination with abnormal findings: Secondary | ICD-10-CM | POA: Diagnosis not present

## 2020-06-21 DIAGNOSIS — R42 Dizziness and giddiness: Secondary | ICD-10-CM | POA: Diagnosis not present

## 2020-06-29 DIAGNOSIS — Z20828 Contact with and (suspected) exposure to other viral communicable diseases: Secondary | ICD-10-CM | POA: Diagnosis not present

## 2020-07-27 DIAGNOSIS — Z1231 Encounter for screening mammogram for malignant neoplasm of breast: Secondary | ICD-10-CM | POA: Diagnosis not present

## 2020-08-14 DIAGNOSIS — Z23 Encounter for immunization: Secondary | ICD-10-CM | POA: Diagnosis not present

## 2020-09-04 ENCOUNTER — Ambulatory Visit: Payer: BC Managed Care – PPO | Attending: Internal Medicine

## 2020-09-04 DIAGNOSIS — Z23 Encounter for immunization: Secondary | ICD-10-CM

## 2020-09-04 NOTE — Progress Notes (Signed)
° °  Covid-19 Vaccination Clinic  Name:  Maria Cochran    MRN: [id number] DOB: 12/15/1955  09/04/2020  Maria Cochran was observed post Covid-19 immunization for 15 minutes without incident. She was provided with Vaccine Information Sheet and instruction to access the V-Safe system.   Maria Cochran was instructed to call 911 with any severe reactions post vaccine:  Difficulty breathing   Swelling of face and throat   A fast heartbeat   A bad rash all over body   Dizziness and weakness

## 2020-11-09 ENCOUNTER — Telehealth: Payer: Self-pay | Admitting: Cardiology

## 2020-11-09 NOTE — Telephone Encounter (Signed)
**Note De-Identified Maria Cochran Obfuscation** I called the pt and she provided the following ins information from her new card: [id number] BIN: [id number] PCN: [id number] GRP: BHG

## 2020-11-09 NOTE — Telephone Encounter (Signed)
Pt c/o medication issue:  1. Name of Medication: nebivolol (BYSTOLIC) 2.5 MG tablet  2. How are you currently taking this medication (dosage and times per day)? 1 tablet by mouth daily, with 1 tablet extra as needed for palpitations   3. Are you having a reaction (difficulty breathing--STAT)? No   4. What is your medication issue? Maria Cochran is calling stating she is needing prior authorization for this medication due to an insurance change. She states the number for prior authorization for [facility] is [phone]. Please advise.

## 2020-11-10 NOTE — Telephone Encounter (Signed)
**Note De-Identified Lynden Flemmer Obfuscation** I started a Nebivolol PA through covermymeds: Key: [facility]

## 2020-11-15 NOTE — Telephone Encounter (Signed)
**Note De-Identified [name] Obfuscation** Letter received from [employer]: Additional information needed for the pts Nebivolol PA. I called [employer] at [phone] and s/w Elsa. I answered all questions as follows: Prior Medications tried and failed: Metoprolol (per chart) and Atenolol (Per pt) Quantity: 100/90 day supply.  Per Alba Cory they will fax [place] their determination in 24 to 72 hours.

## 2020-12-02 NOTE — Telephone Encounter (Signed)
**Note De-Identified Rhoderick Farrel Obfuscation** Letter received from [employer] Meliah Appleman fax stating that they have approved the pts Nebivolol PA. PA reference #: [id number]  The pts pharmacy is aware of this approval.

## 2020-12-30 ENCOUNTER — Other Ambulatory Visit: Payer: Self-pay | Admitting: Cardiology

## 2020-12-30 ENCOUNTER — Other Ambulatory Visit: Payer: Self-pay | Admitting: Physician Assistant

## 2021-02-07 ENCOUNTER — Ambulatory Visit (INDEPENDENT_AMBULATORY_CARE_PROVIDER_SITE_OTHER): Payer: 59 | Admitting: Cardiology

## 2021-02-07 ENCOUNTER — Encounter: Payer: Self-pay | Admitting: Cardiology

## 2021-02-07 ENCOUNTER — Other Ambulatory Visit: Payer: Self-pay

## 2021-02-07 VITALS — BP 130/86 | HR 57 | Ht 67.0 in | Wt 174.8 lb

## 2021-02-07 DIAGNOSIS — I2510 Atherosclerotic heart disease of native coronary artery without angina pectoris: Secondary | ICD-10-CM

## 2021-02-07 DIAGNOSIS — E7800 Pure hypercholesterolemia, unspecified: Secondary | ICD-10-CM | POA: Diagnosis not present

## 2021-02-07 DIAGNOSIS — I471 Supraventricular tachycardia: Secondary | ICD-10-CM | POA: Diagnosis not present

## 2021-02-07 DIAGNOSIS — I10 Essential (primary) hypertension: Secondary | ICD-10-CM

## 2021-02-07 MED ORDER — NITROGLYCERIN 0.4 MG SL SUBL
SUBLINGUAL_TABLET | SUBLINGUAL | 1 refills | Status: DC
Start: 2021-02-07 — End: 2022-03-16

## 2021-02-07 MED ORDER — NEBIVOLOL HCL 2.5 MG PO TABS: ORAL_TABLET | ORAL | 3 refills | Status: DC

## 2021-02-07 MED ORDER — ROSUVASTATIN CALCIUM 20 MG PO TABS
20.0000 mg | ORAL_TABLET | Freq: Every day | ORAL | 3 refills | Status: DC
Start: 2021-02-07 — End: 2022-02-22

## 2021-02-07 NOTE — Patient Instructions (Signed)

## 2021-02-07 NOTE — Progress Notes (Signed)
Cardiology Office Note:    Date:  02/07/2021   ID:  Maria Cochran, DOB 03-Jun-1956, MRN [id number]  PCP:  Blair Heys, MD  Cardiologist:  Armanda Magic, MD    Referring MD: Blair Heys, MD   Chief Complaint  Patient presents with  . Coronary Artery Disease  . Hypertension  . Atrial Fibrillation  . Hyperlipidemia  . Cardiomyopathy    History of Present Illness:    Maria Cochran is a 65 y.o. female with a hx of PVC's and nonsustained atrial tachycardia, dyslipidemia and borderline obstructive ASCAD of the RPDA of 75% with no ischemia on nuclear stress test and nonobstructive elsewhere by cath on medical management.Event monitor showed no arrhythmias.   She is here today for followup and is doing well.  She denies any chest pain or pressure, SOB, DOE(except walking up hills), PND, orthopnea, LE edema, dizziness or syncope. She occasionally will have some palpitations but takes an extra Bystolic and resolves.  She is compliant with her meds and is tolerating meds with no SE.    Past Medical History:  Diagnosis Date  . Atrial tachycardia, paroxysmal (HCC)   . Colonic polyp   . Coronary artery disease 2016   50% prox to mid LAD, 50% ramus, 40% ostial D1, 45% distal LCX and 30% prox to mid RCA and 75% RPDA with no ischemia in inferior wall on nuclear stress test - on medical management with no angina.  . DDD (degenerative disc disease)   . Hypercholesteremia   . Hypertension   . PAC (premature atrial contraction)   . Pulmonary nodule    a. being followed by PCP, stable 07/2016.  [name] PVC's (premature ventricular contractions)   . Sinus bradycardia   . Vertigo     Past Surgical History:  Procedure Laterality Date  . CARDIAC CATHETERIZATION N/A 08/13/2015   Procedure: Left Heart Cath and Coronary Angiography;  Surgeon: Lyn Records, MD;  Location: [facility] [place] INVASIVE CV LAB;  Service: Cardiovascular;  Laterality: N/A;  . TONSILLECTOMY      Current Medications: Current Meds   Medication Sig  . aspirin 81 MG tablet Take 81 mg by mouth daily.  . Coenzyme Q10 (COQ10 PO) Take 1 capsule by mouth daily.   [name] loratadine (CLARITIN) 10 MG tablet Take 10 mg by mouth daily as needed for allergies.   [name] nebivolol (BYSTOLIC) 2.5 MG tablet TAKE 1 TABLET DAILY AND MAY TAKE 1 EXTRA TABLET AS NEEDED FOR PALPITATIONS DAILY.  [name] NITROSTAT 0.4 MG SL tablet PLACE 1 TABLET UNDER THE TONGUE EVERY 5 MINUTES AS NEEDED FOR CHEST PAIN, MAY REPEAT FOR 3 DOSES.  [name] Glycol-Propyl Glycol (SYSTANE OP) Place 2 drops into both eyes as needed (for dry eyes).  . rosuvastatin (CRESTOR) 20 MG tablet TAKE 1 TABLET ONCE DAILY.  [name] triamcinolone cream (KENALOG) 0.5 % Apply 1 application topically 2 (two) times daily.     Allergies:   Sudafed [pseudoephedrine hcl], Metoprolol, Buprenorphine hcl, Morphine and related, Allegra [fexofenadine hcl], Doxycycline monohydrate, Lipitor [atorvastatin], Penicillins, Sulfa antibiotics, Sulfacetamide sodium, and Tetracyclines & related   Social History   Socioeconomic History  . Marital status: Single    Spouse name: Not on file  . Number of children: 0  . Years of education: 27  . Highest education level: Not on file  Occupational History  . Occupation: Environmental manager  Tobacco Use  . Smoking status: Never Smoker  . Smokeless tobacco: Never Used  Vaping Use  . Vaping Use: Never used  Substance and Sexual Activity  . Alcohol use: Yes    Alcohol/week: 0.0 standard drinks    Comment: occasional beer and wine  . Drug use: No  . Sexual activity: Not on file  Other Topics Concern  . Not on file  Social History Narrative   Lives with partner in a one story home.  Has no children.  Works as a Environmental manager.  Education: college.    Social Determinants of Health   Financial Resource Strain: Not on file  Food Insecurity: Not on file  Transportation Needs: Not on file  Physical Activity: Not on file  Stress: Not on file  Social Connections: Not on file      Family History: [name] family history includes CAD in her father; Heart disease in her father; Hypertension in her father; Parkinsonism in her mother.  ROS:   Please see the history of present illness.    ROS  All other systems reviewed and negative.   EKGs/Labs/Other Studies Reviewed:    The following studies were reviewed today: none  EKG:  EKG is ordered today and showed sinus bradycardia at 57bpm with no ST changes  Recent Labs: 06/04/2020: ALT 19   Recent Lipid Panel    Component Value Date/Time   CHOL 139 06/04/2020 0723   TRIG 55 06/04/2020 0723   HDL 72 06/04/2020 0723   CHOLHDL 1.9 06/04/2020 0723   CHOLHDL 1.8 05/12/2016 0818   VLDL 11 05/12/2016 0818   LDLCALC 55 06/04/2020 0723    Physical Exam:    VS:  BP 130/86   Pulse (!) 57   Ht 5\' 7"  (1.702 m)   Wt 174 lb 12.8 oz (79.3 kg)   BMI 27.38 kg/m     Wt Readings from Last 3 Encounters:  02/07/21 174 lb 12.8 oz (79.3 kg)  02/02/20 176 lb 3.2 oz (79.9 kg)  01/06/20 174 lb (78.9 kg)     GEN: Well nourished, well developed in no acute distress HEENT: Normal NECK: No JVD; No carotid bruits LYMPHATICS: No lymphadenopathy CARDIAC:RRR, no murmurs, rubs, gallops RESPIRATORY:  Clear to auscultation without rales, wheezing or rhonchi  ABDOMEN: Soft, non-tender, non-distended MUSCULOSKELETAL:  No edema; No deformity  SKIN: Warm and dry NEUROLOGIC:  Alert and oriented x 3 PSYCHIATRIC:  Normal affect    ASSESSMENT:    1. Atrial tachycardia, paroxysmal (HCC)   2. Coronary artery disease involving native coronary artery of native heart without angina pectoris   3. Primary hypertension   4. Pure hypercholesterolemia    PLAN:    In order of problems listed above:  1.  Nonsustained atrial tachycardia -she is maintaining NSR and still occasionally gets a few palpitations and takes an extra Bystolic -continue Bystolic 2.5mg  daily  2.  ASCAD  -cath 2016 showed 50% prox to mid LAD, 50% ramus,  40% ostial D1, 45% distal LCX and 30% prox to mid RCA and 75% RPDA with no ischemia in inferior wall on nuclear stress test - on medical management.   -she denies any anginal symptoms -continue ASA 81mg  daily, Bystolic and statin  3.  HTN  -Bp is well controlled on exam today -continue Bystolic 2.5mg  daily  4.  Hyperlipidemia  - her LDL goal is < 70.   - her last LDL was 55 in July 2021 - continue Crestor 20mg  daily   Medication Adjustments/Labs and Tests Ordered: Current medicines are reviewed at length with the patient today.  Concerns regarding medicines are outlined above.  No orders  of the defined types were placed in this encounter.  No orders of the defined types were placed in this encounter.   Signed, Armanda Magic, MD  02/07/2021 9:48 AM     [facility]

## 2021-02-15 ENCOUNTER — Ambulatory Visit: Payer: 59 | Attending: Internal Medicine

## 2021-02-15 ENCOUNTER — Other Ambulatory Visit: Payer: Self-pay

## 2021-02-15 ENCOUNTER — Other Ambulatory Visit (HOSPITAL_BASED_OUTPATIENT_CLINIC_OR_DEPARTMENT_OTHER): Payer: Self-pay

## 2021-02-15 DIAGNOSIS — Z23 Encounter for immunization: Secondary | ICD-10-CM

## 2021-02-15 NOTE — Progress Notes (Signed)
   Covid-19 Vaccination Clinic  Name:  Maria Cochran    MRN: [id number] DOB: 02-Dec-1955  02/15/2021  [name] was observed post Covid-19 immunization for 15 minutes without incident. She was provided with Vaccine Information Sheet and instruction to access the V-Safe system.   [name] was instructed to call 911 with any severe reactions post vaccine: [name] Difficulty breathing  . Swelling of face and throat  . A fast heartbeat  . A bad rash all over body  . Dizziness and weakness   Immunizations Administered    Name Date Dose VIS Date Route   PFIZER Comrnaty(Gray TOP) Covid-19 Vaccine 02/15/2021 11:16 AM 0.3 mL 10/14/2020 Intramuscular   Manufacturer: [employer], [employer]   Lot: [id number]   NDC: [phone]

## 2021-02-18 ENCOUNTER — Other Ambulatory Visit (HOSPITAL_BASED_OUTPATIENT_CLINIC_OR_DEPARTMENT_OTHER): Payer: Self-pay

## 2021-02-18 MED ORDER — COVID-19 MRNA VACCINE (PFIZER) 30 MCG/0.3ML IM SUSP
INTRAMUSCULAR | 0 refills | Status: DC
  Filled 2021-02-18: qty 0.3, 1d supply, fill #0

## 2021-04-01 ENCOUNTER — Telehealth: Payer: Self-pay | Admitting: Cardiology

## 2021-04-01 NOTE — Telephone Encounter (Signed)
Pt c/o medication issue:  1. Name of Medication:  nebivolol (BYSTOLIC) 2.5 MG tablet   2. How are you currently taking this medication (dosage and times per day)?  1 tablet daily  3. Are you having a reaction (difficulty breathing--STAT)?  No   4. What is your medication issue?  Patient is requesting to speak with Dr. Norris Cross nurse. She states she has been feeling extremely fatigued and "uneven." she states she is unable to describe the feeling she is having. She denies dizziness and states her HR has not been irregular. She would like to know if she needs adjust her medication.

## 2021-04-01 NOTE — Telephone Encounter (Signed)
Returned call to Pt.  She was concerned that she seems to have breakthrough palpitations every 2-3 months.    This nurse advised that this is nothing to worry about.  Reiterated benign nature of her PAC's.  Advised ok to take extra Bystolic as needed.  Pt reassured.

## 2021-05-26 ENCOUNTER — Other Ambulatory Visit (HOSPITAL_BASED_OUTPATIENT_CLINIC_OR_DEPARTMENT_OTHER): Payer: Self-pay

## 2021-07-13 DIAGNOSIS — M545 Low back pain, unspecified: Secondary | ICD-10-CM | POA: Insufficient documentation

## 2021-07-13 DIAGNOSIS — M25552 Pain in left hip: Secondary | ICD-10-CM | POA: Insufficient documentation

## 2021-07-15 ENCOUNTER — Other Ambulatory Visit (HOSPITAL_BASED_OUTPATIENT_CLINIC_OR_DEPARTMENT_OTHER): Payer: Self-pay

## 2021-12-12 ENCOUNTER — Telehealth: Payer: Self-pay | Admitting: Cardiology

## 2021-12-12 DIAGNOSIS — I471 Supraventricular tachycardia: Secondary | ICD-10-CM

## 2021-12-12 NOTE — Telephone Encounter (Signed)
Spoke with the patient who reports that last week she had a couple of episodes that she described as "sinking" spells. She states that it occurred on different occasions in the morning when she was out walking her dog. She states that she didn't really feel lightheaded and is unsure how to describe the feeling. Did not feel like she was going to pass out but reports some weakness. She also reports that she has not had as much energy lately. She reports some elevated blood pressure readings. Typically her blood pressure is around 110-120/70s. She confirms she is taking nebivolol 2.5 mg daily. She states that she has not really noticed any palpitations recently. Advised to continue with medications and continue recording her BP readings and that I would let her know if Dr. Radford Pax has any further recommendations.

## 2021-12-12 NOTE — Telephone Encounter (Signed)
Pt sent this via MyChart message to the sching pool:     ===View-only below this line===     Hello, I realized my BP was running high when I attended an event on Friday where they offered the service by a nurse. Friday 153/89 Sat. 145/86 Sun. 145/85 Mon. 132/75 this morning. My energy level is not up to par, last week when walking in the am, a couple of mornings I felt like my BP dropped or I was having a "sinking" spell lasting only a few seconds/minute.   Does this help? thanks, Thad Ranger Morning Larita Fife  Can you tell me a little about your Blood Pressure so I can forward this info to one of our triage nurse to evaluate?    1. What are your last 5 BP readings?   2. Are you having any other symptoms (ex. Dizziness, headache, blurred vision, passed out)?   3. What is your BP issue?      Appointment Request From: [name]  With Provider: Armanda Magic, MD [facility] [address] Office]  Preferred Date Range: 12/12/2021 - 12/13/2021  Preferred Times: Any Time  Reason for visit: Office Visit  Comments: My blood pressure has been running high. Not as much energy and concern about my heart health.

## 2021-12-19 ENCOUNTER — Ambulatory Visit (INDEPENDENT_AMBULATORY_CARE_PROVIDER_SITE_OTHER): Payer: Medicare Other

## 2021-12-19 DIAGNOSIS — I471 Supraventricular tachycardia: Secondary | ICD-10-CM

## 2022-01-02 DIAGNOSIS — E7800 Pure hypercholesterolemia, unspecified: Secondary | ICD-10-CM | POA: Diagnosis not present

## 2022-01-02 DIAGNOSIS — I493 Ventricular premature depolarization: Secondary | ICD-10-CM | POA: Diagnosis not present

## 2022-01-02 DIAGNOSIS — Z79899 Other long term (current) drug therapy: Secondary | ICD-10-CM | POA: Diagnosis not present

## 2022-01-02 DIAGNOSIS — Z1159 Encounter for screening for other viral diseases: Secondary | ICD-10-CM | POA: Diagnosis not present

## 2022-01-02 DIAGNOSIS — Z0000 Encounter for general adult medical examination without abnormal findings: Secondary | ICD-10-CM | POA: Diagnosis not present

## 2022-01-04 DIAGNOSIS — R42 Dizziness and giddiness: Secondary | ICD-10-CM | POA: Diagnosis not present

## 2022-01-10 ENCOUNTER — Telehealth: Payer: Self-pay | Admitting: Cardiology

## 2022-01-10 NOTE — Telephone Encounter (Signed)
?  Transferred to RN Carly for pt's heart monitor result ?

## 2022-01-23 DIAGNOSIS — D3132 Benign neoplasm of left choroid: Secondary | ICD-10-CM | POA: Diagnosis not present

## 2022-02-22 ENCOUNTER — Other Ambulatory Visit: Payer: Self-pay | Admitting: Cardiology

## 2022-03-01 DIAGNOSIS — M79605 Pain in left leg: Secondary | ICD-10-CM | POA: Diagnosis not present

## 2022-03-01 DIAGNOSIS — M79604 Pain in right leg: Secondary | ICD-10-CM | POA: Diagnosis not present

## 2022-03-16 ENCOUNTER — Other Ambulatory Visit: Payer: Self-pay | Admitting: Cardiology

## 2022-05-12 ENCOUNTER — Ambulatory Visit: Payer: Medicare Other | Admitting: Cardiology

## 2022-05-28 NOTE — Progress Notes (Signed)
Cardiology Office Note    Date:  05/29/2022   ID:  LABRISHA WUELLNER, DOB April 19, 1956, MRN [id number]  PCP:  Blair Heys, MD  Cardiologist:  Armanda Magic, MD  Electrophysiologist:  None   Chief Complaint: f/u PACs, PVCs, CAD  History of Present Illness:   Maria Cochran is a 66 y.o. female with history of PVCs, PACs, nonsustained atrial tachycardia, baseline sinus bradycardia, dyslipidemia, borderline CAD in 2016 (RPDA of 75% with nonobstructive disease elsewhere, treated medically), HTN, vertigo, "sinking spells" of unclear etiology, mild MR by echo 2021 who presents for follow-up.  Maria Cochran has been previously followed for history of CAD, palpitations and "sinking spells" without clear cut cardiac etiology.   Regarding CAD:  - LHC 08/2015 showed 50% ramus, 50% prox-mid LAD, 40% D1, 45% Cx, 30% prox-mid RCA, 75% RPDA - Nuclear stress test 07/2015 showed prior MI with peri-infarct ischemia, EF 75%, managed medically - Exercise nuclear stress test 02/2018 was normal - Last echo 01/2020 showed normal LVEF 60-65%, G2DD, normal RV, mild LAE, mild MR.  Regarding palpitations/sinking spells:  - remote monitor 2016 for palpitations NSR, occasional PACs/PVCs and nonsustained atrial tachycardia - repeat monitor 02/2018 for sinking spells showed no arrhythmias - repeat monitor 01/2020 showed avg 69bpm, range 48-140bpm, rare PVCs, frequent PACs, bigeminal PACs, and nonsustained atrial tachycardia. Patient triggers were associated with NSR, occasional PACs, and 1 episode of brief a-tach - repeat monitor 01/2022 that was felt to be normal with average HR 70bpm, range 54-123bpm, rare PAC -> she does not recall whether or not she had symptoms during monitor - head CT and brain MRI previously were unrevealing for acute etiology - had incidental choroid fissure noted, mild patchy changes felt related to chronic small vessel ischemic disease, mild for age - prior orthostatics unrevealing - also has h/o intermittent  numbness in her arm and face -> she was previously referred to neurology but no diagnosis made - intolerant of metoprolol so had been managed with Bystolic  She is seen for follow-up today overall doing well. She reports that earlier this year she was having elevated BP that has since leveled out on its own. She feels that her body goes through cycles of doing well then not doing well. The sinking spells still happen at least once a week, usually at rest without associated symptoms. Very transient/fleeting. Do not happen with exertion. No associated palpitations. No chest pain, syncope. She has noticed increased DOE over several months' time. She is not sure if this is related to decreased activity but is concerned to get this checked out. Regarding lipids, she previously reports intolerance to atorvastatin as well as episodic intolerance to rosuvastatin. She felt joint aches last year that resolved off rosuvastatin at primary care, but with starting back, she has done OK.   Labwork independently reviewed: 12/2021 CMET wnl K 4.7, LDL 74, Cr 0.80, glucose 93, HDL 68, trig 91 05/05/21 Hgb 13.7, TSH wnl 01/2020 CBC wnl, Mg 2.1   Cardiology Studies:   Studies reviewed are outlined and summarized above. Reports included below if pertinent.   Monitor 01/2022 Predominant rhythm was normal sinus rhythm with an average heart rate of 70 bpm and ranged from 54 to 123 bpm. Rare PAC   Monitor 01/2020 Sinus bradycardia, normal sinus rhythm and sinus tachcyardia. The average heart rate was 69 bpm and ranged from 48 to 140bpm Rare PVC Frequent PACs, bigeminal PACs and nonsustained atrial tachycardia up to 16 beats.  Echo 01/2020  1. Left ventricular ejection fraction, by estimation, is 60 to 65%. The  left ventricle has normal function. The left ventricle has no regional  wall motion abnormalities. Left ventricular diastolic parameters are  consistent with Grade II diastolic  dysfunction  (pseudonormalization).   2. Right ventricular systolic function is normal. The right ventricular  size is normal. There is normal pulmonary artery systolic pressure. The  estimated right ventricular systolic pressure is 29.8 mmHg.   3. Left atrial size was mildly dilated.   4. The mitral valve is normal in structure and function. Mild mitral  valve regurgitation. No evidence of mitral stenosis.   5. The aortic valve is normal in structure and function. Aortic valve  regurgitation is trivial. No aortic stenosis is present.   6. The inferior vena cava is normal in size with greater than 50%  respiratory variability, suggesting right atrial pressure of 3 mmHg.   2D echo 09/2016 - Left ventricle: The cavity size was normal. Systolic function was    normal. The estimated ejection fraction was in the range of 60%    to 65%. Wall motion was normal; there were no regional wall    motion abnormalities. Features are consistent with a pseudonormal    left ventricular filling pattern, with concomitant abnormal    relaxation and increased filling pressure (grade 2 diastolic    dysfunction).  - Aortic valve: Trileaflet; mildly thickened, mildly calcified    leaflets. There was mild regurgitation.  - Mitral valve: There was mild regurgitation.  - Left atrium: The atrium was mildly dilated.  - Pulmonic valve: There was trivial regurgitation.    NST 02/2018  Nuclear stress EF: 68%. The left ventricular ejection fraction is hyperdynamic (>65%).  Patient walked on a Bruce protocol treadmill test for a total of 9 minutes and 3 seconds. She achieved a peak heart rate of 157 which is 99% predicted maximal heart rate.  At peak exercise there were no ST or T wave changes to suggest ischemia. Her blood pressure response to exercise was hypertensive.  The study is normal.  This is a low risk study.   Monitor 02/2018  Normal sinus rhythm with heart rate ranging from 60-100bpm  Echo 01/2020  1. Left  ventricular ejection fraction, by estimation, is 60 to 65%. The  left ventricle has normal function. The left ventricle has no regional  wall motion abnormalities. Left ventricular diastolic parameters are  consistent with Grade II diastolic  dysfunction (pseudonormalization).   2. Right ventricular systolic function is normal. The right ventricular  size is normal. There is normal pulmonary artery systolic pressure. The  estimated right ventricular systolic pressure is 29.8 mmHg.   3. Left atrial size was mildly dilated.   4. The mitral valve is normal in structure and function. Mild mitral  valve regurgitation. No evidence of mitral stenosis.   5. The aortic valve is normal in structure and function. Aortic valve  regurgitation is trivial. No aortic stenosis is present.   6. The inferior vena cava is normal in size with greater than 50%  respiratory variability, suggesting right atrial pressure of 3 mmHg.   2D echo 09/2016 - Left ventricle: The cavity size was normal. Systolic function was    normal. The estimated ejection fraction was in the range of 60%    to 65%. Wall motion was normal; there were no regional wall    motion abnormalities. Features are consistent with a pseudonormal    left ventricular filling pattern, with concomitant  abnormal    relaxation and increased filling pressure (grade 2 diastolic    dysfunction).  - Aortic valve: Trileaflet; mildly thickened, mildly calcified    leaflets. There was mild regurgitation.  - Mitral valve: There was mild regurgitation.  - Left atrium: The atrium was mildly dilated.  - Pulmonic valve: There was trivial regurgitation.    NST 02/2018 Nuclear stress EF: 68%. The left ventricular ejection fraction is hyperdynamic (>65%). Patient walked on a Bruce protocol treadmill test for a total of 9 minutes and 3 seconds. She achieved a peak heart rate of 157 which is 99% predicted maximal heart rate. At peak exercise there were no ST or T  wave changes to suggest ischemia. Her blood pressure response to exercise was hypertensive. The study is normal. This is a low risk study.   Monitor 02/2018 Normal sinus rhythm with heart rate ranging from 60-100bpm  Cath 2016 Ramus lesion, 50% stenosed. Prox LAD to Mid LAD lesion, 50% stenosed. Ost 1st Diag lesion, 40% stenosed. Dist Cx lesion, 45% stenosed. Prox RCA to Mid RCA lesion, 30% stenosed. RPDA lesion, 75% stenosed.    Nonobstructive , 50%, mid left anterior descending coronary disease. Luminal irregularities in the distal circumflex.  75%  mid PDA stenosis. No evidence of inferior ischemia on cardio perfusion imaging. No symptoms of angina. Medical therapy indicated.  Normal left ventricular function.        RECOMMENDATIONS:  Aggressive risk factor modification      Past Medical History:  Diagnosis Date   Atrial tachycardia, paroxysmal (HCC)    Colonic polyp    Coronary artery disease 2016   50% prox to mid LAD, 50% ramus, 40% ostial D1, 45% distal LCX and 30% prox to mid RCA and 75% RPDA with no ischemia in inferior wall on nuclear stress test - on medical management with no angina.   DDD (degenerative disc disease)    Hypercholesteremia    Hypertension    PAC (premature atrial contraction)    Pulmonary nodule    a. being followed by PCP, stable 07/2016.   PVC's (premature ventricular contractions)    Sinus bradycardia    Vertigo     Past Surgical History:  Procedure Laterality Date   CARDIAC CATHETERIZATION N/A 08/13/2015   Procedure: Left Heart Cath and Coronary Angiography;  Surgeon: Lyn Records, MD;  Location: [facility] INVASIVE CV LAB;  Service: Cardiovascular;  Laterality: N/A;   TONSILLECTOMY      Current Medications: Current Meds  Medication Sig   aspirin 81 MG tablet Take 81 mg by mouth daily.   Coenzyme Q10 (COQ10 PO) Take 1 capsule by mouth daily.    loratadine (CLARITIN) 10 MG tablet Take 10 mg by mouth daily as needed for allergies.     nebivolol (BYSTOLIC) 2.5 MG tablet Take 1 tablet daily and may take 1 extra tablet as needed for palpitations daily   nitroGLYCERIN (NITROSTAT) 0.4 MG SL tablet PLACE 1 TABLET UNDER THE TONGUE EVERY 5 MINUTES AS NEEDED FOR CHEST PAIN, MAY REPEAT FOR 3 DOSES.   Polyethyl Glycol-Propyl Glycol (SYSTANE OP) Place 2 drops into both eyes as needed (for dry eyes).   rosuvastatin (CRESTOR) 20 MG tablet TAKE ONE TABLET BY MOUTH DAILY.   triamcinolone cream (KENALOG) 0.5 % Apply 1 application topically 2 (two) times daily.     Allergies:   Sudafed [pseudoephedrine hcl], Metoprolol, Buprenorphine hcl, Morphine and related, Allegra [fexofenadine hcl], Doxycycline monohydrate, Lipitor [atorvastatin], Penicillins, Sulfa antibiotics, Sulfacetamide sodium, and Tetracyclines & related  Social History   Socioeconomic History   Marital status: Single    Spouse name: Not on file   Number of children: 0   Years of education: 16   Highest education level: Not on file  Occupational History   Occupation: Environmental manager  Tobacco Use   Smoking status: Never   Smokeless tobacco: Never  Vaping Use   Vaping Use: Never used  Substance and Sexual Activity   Alcohol use: Yes    Alcohol/week: 0.0 standard drinks of alcohol    Comment: occasional beer and wine   Drug use: No   Sexual activity: Not on file  Other Topics Concern   Not on file  Social History Narrative   Lives with partner in a one story home.  Has no children.  Works as a Environmental manager.  Education: college.    Social Determinants of Health   Financial Resource Strain: Not on file  Food Insecurity: Not on file  Transportation Needs: Not on file  Physical Activity: Not on file  Stress: Not on file  Social Connections: Not on file     Family History:  [name] family history includes CAD in her father; Heart disease in her father; Hypertension in her father; Parkinsonism in her mother.  ROS:   Please see the history of present illness.   All other systems are reviewed and otherwise negative.    EKG(s)/Additional Labs   EKG:  EKG is ordered today, personally reviewed, demonstrating SB 58bpm, no acute STT changes, QTc wnl  Recent Labs: No results found for requested labs within last 365 days.  Recent Lipid Panel    Component Value Date/Time   CHOL 139 06/04/2020 0723   TRIG 55 06/04/2020 0723   HDL 72 06/04/2020 0723   CHOLHDL 1.9 06/04/2020 0723   CHOLHDL 1.8 05/12/2016 0818   VLDL 11 05/12/2016 0818   LDLCALC 55 06/04/2020 0723    PHYSICAL EXAM:    VS:  BP 130/70   Pulse (!) 58   Ht 5\' 8"  (1.727 m)   Wt 176 lb (79.8 kg)   SpO2 97%   BMI 26.76 kg/m   BMI: Body mass index is 26.76 kg/m.  GEN: Well nourished, well developed female in no acute distress HEENT: normocephalic, atraumatic Neck: no JVD, carotid bruits, or masses Cardiac: RRR; no murmurs, rubs, or gallops, no edema  Respiratory:  clear to auscultation bilaterally, normal work of breathing GI: soft, nontender, nondistended, + BS MS: no deformity or atrophy Skin: warm and dry, no rash Neuro:  Alert and Oriented x 3, Strength and sensation are intact, follows commands Psych: euthymic mood, full affect  Wt Readings from Last 3 Encounters:  05/29/22 176 lb (79.8 kg)  02/07/21 174 lb 12.8 oz (79.3 kg)  02/02/20 176 lb 3.2 oz (79.9 kg)     ASSESSMENT & PLAN:   1. Dyspnea on exertion - patient is unsure if related to deconditioning. She walks her dog regularly and feels she will be able to walk on a treadmill safely. Will pursue exercise nuclear stress test for evaluation. Discussed potential to change to Lexiscan if unable to get HR up. Will plan to have her hold her beta blocker the day of the test. Will also update her CBC when she returns for follow-up. If nuclear stress test is unrevealing, could consider echocardiogram if dyspnea persists, but no significant murmurs heard on exam and she appears euvolemic.  Shared Decision Making/Informed  Consent The risks [chest pain, shortness of breath, cardiac arrhythmias,  dizziness, blood pressure fluctuations, myocardial infarction, stroke/transient ischemic attack, nausea, vomiting, allergic reaction, radiation exposure, metallic taste sensation and life-threatening complications (estimated to be 1 in 10,000)], benefits (risk stratification, diagnosing coronary artery disease, treatment guidance) and alternatives of a nuclear stress test were discussed in detail with Maria Cochran and she agrees to proceed.  2. Sinking spells (coding as dizziness) - have been ongoing for at least 4 years of unclear etiology. Prior monitors have triggered for NSR, sinus with PACs, and one brief episode of atrial tach therefore it is unclear if this is related to a cardiac issue. Regardless, she affirms she had had multiple episodes in the past while wearing the monitors and we fortunately have not seen any dangerous arrhythmias. She has followed up with her PCP in the past who recommended cardiology follow-up. We will obtain a carotid duplex for completeness. Will get updated TSH when she returns for labs.  3. Nonsustained atrial tachycardia, PVCs, PACs - continue nebivolol at present dose. If these become more problematic in the future, consider referral to EP for consideration of antiarrhythmic.   4. CAD - update stress test as above. Continue ASA, BB, statin.  5. Essential HTN - top-normal but largely controlled, no changes made today. Follow studies above.  6. Hyperlipidemia - LDL not at goal by labs in 12/2021. H/o statin intolerance with atorvastatin, some difficulty with rosuvastatin last year as well though now tolerating. Recheck CMET/lipids when she returns for labs fasting, and will refer to lipid clinic to discuss alternative therapies to bring her to goal.     Disposition: F/u with Dr. Mayford Knifeurner in 6 months, sooner if needed.   Medication Adjustments/Labs and Tests Ordered: Current medicines are reviewed  at length with the patient today.  Concerns regarding medicines are outlined above. Medication changes, Labs and Tests ordered today are summarized above and listed in the Patient Instructions accessible in Encounters.   Signed, Laurann Montanaayna N Alannie Amodio, PA-C  05/29/2022 4:05 PM    [facility] Phone: [phone]; Fax: [phone]

## 2022-05-29 ENCOUNTER — Encounter: Payer: Self-pay | Admitting: Physician Assistant

## 2022-05-29 ENCOUNTER — Ambulatory Visit: Payer: Medicare Other | Admitting: Physician Assistant

## 2022-05-29 VITALS — BP 130/70 | HR 58 | Ht 68.0 in | Wt 176.0 lb

## 2022-05-29 DIAGNOSIS — I2510 Atherosclerotic heart disease of native coronary artery without angina pectoris: Secondary | ICD-10-CM

## 2022-05-29 DIAGNOSIS — R42 Dizziness and giddiness: Secondary | ICD-10-CM | POA: Diagnosis not present

## 2022-05-29 DIAGNOSIS — I493 Ventricular premature depolarization: Secondary | ICD-10-CM

## 2022-05-29 DIAGNOSIS — R0609 Other forms of dyspnea: Secondary | ICD-10-CM | POA: Diagnosis not present

## 2022-05-29 DIAGNOSIS — I10 Essential (primary) hypertension: Secondary | ICD-10-CM

## 2022-05-29 DIAGNOSIS — I471 Supraventricular tachycardia: Secondary | ICD-10-CM | POA: Diagnosis not present

## 2022-05-29 DIAGNOSIS — I491 Atrial premature depolarization: Secondary | ICD-10-CM | POA: Diagnosis not present

## 2022-05-29 DIAGNOSIS — I4719 Other supraventricular tachycardia: Secondary | ICD-10-CM

## 2022-05-29 DIAGNOSIS — E785 Hyperlipidemia, unspecified: Secondary | ICD-10-CM

## 2022-05-29 NOTE — Patient Instructions (Signed)
Medication Instructions:  Your physician recommends that you continue on your current medications as directed. Please refer to the Current Medication list given to you today. *If you need a refill on your cardiac medications before your next appointment, please call your pharmacy*   Lab Work: THIS WEEK CMET, LIPIDS, TSH, CBC If you have labs (blood work) drawn today and your tests are completely normal, you will receive your results only by: MyChart Message (if you have MyChart) OR A paper copy in the mail If you have any lab test that is abnormal or we need to change your treatment, we will call you to review the results.   Testing/Procedures: Your physician has requested that you have an exercise stress myoview. For further information please visit [email or web address] Please follow instruction sheet, as given. HOLD BETA BLOCKER  Your physician has requested that you have a carotid duplex. This test is an ultrasound of the carotid arteries in your neck. It looks at blood flow through these arteries that supply the brain with blood. Allow one hour for this exam. There are no restrictions or special instructions.  Follow-Up: At [facility], you and your health needs are our priority.  As part of our continuing mission to provide you with exceptional heart care, we have created designated Provider Care Teams.  These Care Teams include your primary Cardiologist (physician) and Advanced Practice Providers (APPs -  Physician Assistants and Nurse Practitioners) who all work together to provide you with the care you need, when you need it.  We recommend signing up for the patient portal called "MyChart".  Sign up information is provided on this After Visit Summary.  MyChart is used to connect with patients for Virtual Visits (Telemedicine).  Patients are able to view lab/test results, encounter notes, upcoming appointments, etc.  Non-urgent messages can be sent to your provider as well.   To  learn more about what you can do with MyChart, go to [email or web address].    Your next appointment:   6 month(s)  The format for your next appointment:   In Person  Provider:   Armanda Magic, MD     Other Instructions   Important Information About Sugar

## 2022-05-30 ENCOUNTER — Other Ambulatory Visit: Payer: Self-pay | Admitting: Physician Assistant

## 2022-05-30 ENCOUNTER — Telehealth (HOSPITAL_COMMUNITY): Payer: Self-pay

## 2022-05-30 DIAGNOSIS — R42 Dizziness and giddiness: Secondary | ICD-10-CM

## 2022-05-30 NOTE — Telephone Encounter (Signed)
Attempted to contact the patient. Message said she was unavailable. Will try again later. S.Izan Miron EMTP

## 2022-05-31 ENCOUNTER — Telehealth (HOSPITAL_COMMUNITY): Payer: Self-pay | Admitting: *Deleted

## 2022-05-31 NOTE — Telephone Encounter (Signed)
Patient given detailed instructions per Myocardial Perfusion Study Information Sheet for the test on 06/01/22 at 7:45. Patient notified to arrive 15 minutes early and that it is imperative to arrive on time for appointment to keep from having the test rescheduled.  If you need to cancel or reschedule your appointment, please call the office within 24 hours of your appointment. . Patient verbalized understanding.Daneil Dolin

## 2022-06-01 ENCOUNTER — Other Ambulatory Visit: Payer: Medicare Other

## 2022-06-01 ENCOUNTER — Ambulatory Visit (HOSPITAL_COMMUNITY): Payer: Medicare Other | Attending: Physician Assistant

## 2022-06-01 DIAGNOSIS — R42 Dizziness and giddiness: Secondary | ICD-10-CM | POA: Diagnosis not present

## 2022-06-01 DIAGNOSIS — I2510 Atherosclerotic heart disease of native coronary artery without angina pectoris: Secondary | ICD-10-CM

## 2022-06-01 DIAGNOSIS — E785 Hyperlipidemia, unspecified: Secondary | ICD-10-CM | POA: Insufficient documentation

## 2022-06-01 DIAGNOSIS — I493 Ventricular premature depolarization: Secondary | ICD-10-CM | POA: Diagnosis not present

## 2022-06-01 DIAGNOSIS — I491 Atrial premature depolarization: Secondary | ICD-10-CM | POA: Diagnosis not present

## 2022-06-01 DIAGNOSIS — R0609 Other forms of dyspnea: Secondary | ICD-10-CM

## 2022-06-01 DIAGNOSIS — I471 Supraventricular tachycardia: Secondary | ICD-10-CM | POA: Diagnosis not present

## 2022-06-01 DIAGNOSIS — I10 Essential (primary) hypertension: Secondary | ICD-10-CM | POA: Diagnosis not present

## 2022-06-01 LAB — MYOCARDIAL PERFUSION IMAGING
Angina Index: 0
Duke Treadmill Score: 7
Estimated workload: 8.5
Exercise duration (min): 7 min
LV dias vol: 51 mL (ref 46–106)
LV sys vol: 15 mL
MPHR: 155 {beats}/min
Nuc Stress EF: 71 %
Peak HR: 150 {beats}/min
Percent HR: 96 %
RPE: 18
Rest HR: 63 {beats}/min
Rest Nuclear Isotope Dose: 10.1 mCi
SDS: 2
SRS: 0
SSS: 2
ST Depression (mm): 0 mm
Stress Nuclear Isotope Dose: 29.8 mCi
TID: 1

## 2022-06-01 MED ORDER — TECHNETIUM TC 99M TETROFOSMIN IV KIT
29.8000 | PACK | Freq: Once | INTRAVENOUS | Status: AC | PRN
  Administered 2022-06-01: 29.8 via INTRAVENOUS

## 2022-06-01 MED ORDER — TECHNETIUM TC 99M TETROFOSMIN IV KIT
10.1000 | PACK | Freq: Once | INTRAVENOUS | Status: AC | PRN
  Administered 2022-06-01: 10.1 via INTRAVENOUS

## 2022-06-02 LAB — COMPREHENSIVE METABOLIC PANEL
ALT: 13 IU/L (ref 0–32)
AST: 16 IU/L (ref 0–40)
Albumin/Globulin Ratio: 2.4 — ABNORMAL HIGH (ref 1.2–2.2)
Albumin: 4.4 g/dL (ref 3.9–4.9)
Alkaline Phosphatase: 74 IU/L (ref 44–121)
BUN/Creatinine Ratio: 17 (ref 12–28)
BUN: 14 mg/dL (ref 8–27)
Bilirubin Total: 0.4 mg/dL (ref 0.0–1.2)
CO2: 25 mmol/L (ref 20–29)
Calcium: 8.9 mg/dL (ref 8.7–10.3)
Chloride: 104 mmol/L (ref 96–106)
Creatinine, Ser: 0.82 mg/dL (ref 0.57–1.00)
Globulin, Total: 1.8 g/dL (ref 1.5–4.5)
Glucose: 100 mg/dL — ABNORMAL HIGH (ref 70–99)
Potassium: 4.3 mmol/L (ref 3.5–5.2)
Sodium: 141 mmol/L (ref 134–144)
Total Protein: 6.2 g/dL (ref 6.0–8.5)
eGFR: 79 mL/min/{1.73_m2} (ref >59)

## 2022-06-02 LAB — CBC
Hematocrit: 42.1 % (ref 34.0–46.6)
Hemoglobin: 14.3 g/dL (ref 11.1–15.9)
MCH: 31.2 pg (ref 26.6–33.0)
MCHC: 34 g/dL (ref 31.5–35.7)
MCV: 92 fL (ref 79–97)
Platelets: 202 10*3/uL (ref 150–450)
RBC: 4.58 x10E6/uL (ref 3.77–5.28)
RDW: 12.9 % (ref 11.7–15.4)
WBC: 5.5 10*3/uL (ref 3.4–10.8)

## 2022-06-02 LAB — TSH: TSH: 3.94 u[IU]/mL (ref 0.450–4.500)

## 2022-06-02 LAB — LIPID PANEL
Chol/HDL Ratio: 2.1 ratio (ref 0.0–4.4)
Cholesterol, Total: 141 mg/dL (ref 100–199)
HDL: 68 mg/dL (ref >39)
LDL Chol Calc (NIH): 58 mg/dL (ref 0–99)
Triglycerides: 74 mg/dL (ref 0–149)
VLDL Cholesterol Cal: 15 mg/dL (ref 5–40)

## 2022-06-07 ENCOUNTER — Ambulatory Visit (HOSPITAL_COMMUNITY)
Admission: RE | Admit: 2022-06-07 | Discharge: 2022-06-07 | Disposition: A | Discharge status: Home / Self Care | Payer: Medicare Other | Source: Ambulatory Visit | Attending: Cardiovascular Disease | Admitting: Cardiovascular Disease

## 2022-06-07 DIAGNOSIS — R42 Dizziness and giddiness: Secondary | ICD-10-CM | POA: Insufficient documentation

## 2022-06-15 DIAGNOSIS — R7309 Other abnormal glucose: Secondary | ICD-10-CM | POA: Diagnosis not present

## 2022-06-22 DIAGNOSIS — R079 Chest pain, unspecified: Secondary | ICD-10-CM | POA: Diagnosis not present

## 2022-06-22 DIAGNOSIS — R1013 Epigastric pain: Secondary | ICD-10-CM | POA: Diagnosis not present

## 2022-06-22 DIAGNOSIS — N644 Mastodynia: Secondary | ICD-10-CM | POA: Diagnosis not present

## 2022-06-26 DIAGNOSIS — I2510 Atherosclerotic heart disease of native coronary artery without angina pectoris: Secondary | ICD-10-CM | POA: Diagnosis not present

## 2022-06-26 DIAGNOSIS — R7303 Prediabetes: Secondary | ICD-10-CM | POA: Diagnosis not present

## 2022-06-26 DIAGNOSIS — E7800 Pure hypercholesterolemia, unspecified: Secondary | ICD-10-CM | POA: Diagnosis not present

## 2022-07-07 DIAGNOSIS — J029 Acute pharyngitis, unspecified: Secondary | ICD-10-CM | POA: Diagnosis not present

## 2022-08-08 DIAGNOSIS — Z1231 Encounter for screening mammogram for malignant neoplasm of breast: Secondary | ICD-10-CM | POA: Diagnosis not present

## 2022-09-21 ENCOUNTER — Other Ambulatory Visit (HOSPITAL_BASED_OUTPATIENT_CLINIC_OR_DEPARTMENT_OTHER): Payer: Self-pay

## 2022-09-21 MED ORDER — COMIRNATY 30 MCG/0.3ML IM SUSY
PREFILLED_SYRINGE | INTRAMUSCULAR | 0 refills | Status: DC
  Filled 2022-09-21: qty 0.3, 1d supply, fill #0

## 2022-10-13 ENCOUNTER — Other Ambulatory Visit (HOSPITAL_BASED_OUTPATIENT_CLINIC_OR_DEPARTMENT_OTHER): Payer: Self-pay

## 2022-10-13 MED ORDER — INFLUENZA VAC SPLIT QUAD 0.5 ML IM SUSY
PREFILLED_SYRINGE | INTRAMUSCULAR | 0 refills | Status: DC
  Filled 2022-10-13: qty 0.5, 1d supply, fill #0

## 2022-11-03 DIAGNOSIS — R0781 Pleurodynia: Secondary | ICD-10-CM | POA: Diagnosis not present

## 2022-11-22 ENCOUNTER — Other Ambulatory Visit (HOSPITAL_COMMUNITY): Payer: Self-pay | Admitting: Physician Assistant

## 2022-11-22 DIAGNOSIS — R109 Unspecified abdominal pain: Secondary | ICD-10-CM

## 2022-11-22 DIAGNOSIS — R3915 Urgency of urination: Secondary | ICD-10-CM | POA: Diagnosis not present

## 2022-11-22 DIAGNOSIS — R7303 Prediabetes: Secondary | ICD-10-CM | POA: Diagnosis not present

## 2022-11-22 DIAGNOSIS — Z86010 Personal history of colonic polyps: Secondary | ICD-10-CM | POA: Diagnosis not present

## 2022-11-23 ENCOUNTER — Ambulatory Visit: Payer: Medicare Other | Attending: Cardiology | Admitting: Cardiology

## 2022-11-23 ENCOUNTER — Encounter: Payer: Self-pay | Admitting: Cardiology

## 2022-11-23 VITALS — BP 132/60 | HR 65 | Ht 67.0 in | Wt 173.4 lb

## 2022-11-23 DIAGNOSIS — I2510 Atherosclerotic heart disease of native coronary artery without angina pectoris: Secondary | ICD-10-CM | POA: Diagnosis not present

## 2022-11-23 DIAGNOSIS — E785 Hyperlipidemia, unspecified: Secondary | ICD-10-CM | POA: Diagnosis not present

## 2022-11-23 DIAGNOSIS — I10 Essential (primary) hypertension: Secondary | ICD-10-CM

## 2022-11-23 DIAGNOSIS — I4719 Other supraventricular tachycardia: Secondary | ICD-10-CM | POA: Diagnosis not present

## 2022-11-23 DIAGNOSIS — R0609 Other forms of dyspnea: Secondary | ICD-10-CM

## 2022-11-23 NOTE — Progress Notes (Signed)
Cardiology Office Note:    Date:  11/23/2022   ID:  Maria Cochran, DOB Mar 20, 1956, MRN [id number]  PCP:  Gaynelle Arabian, MD  Cardiologist:  Fransico Him, MD    Referring MD: Gaynelle Arabian, MD   Chief Complaint  Patient presents with   Follow-up    Atrial tachycardia, hyperlipidemia, hypertension, CAD  She  History of Present Illness:    Maria Cochran is a 67 y.o. female with a hx of PVC's and nonsustained atrial tachycardia, dyslipidemia and borderline obstructive ASCAD of the RPDA of 75% with no ischemia on nuclear stress test and nonobstructive elsewhere by cath on medical management.  Event monitor 3/23 showed no arrhythmias. She had a nuclear stress test in 7/23 for SOB that showed no ischemia and normal LVF.  Hbg and TSH were normal in July 23.   She is here today for followup and is doing well.  Still has DOE at times and gets tired easier.  She denies any chest pain or pressure, PND, orthopnea, dizziness or syncope. Occasionally she will have some ankle edema and sometimes still notice her heart beating fast. She is compliant with her meds and is tolerating meds with no SE.      Past Medical History:  Diagnosis Date   Atrial tachycardia, paroxysmal    Colonic polyp    Coronary artery disease 2016   50% prox to mid LAD, 50% ramus, 40% ostial D1, 45% distal LCX and 30% prox to mid RCA and 75% RPDA with no ischemia in inferior wall on nuclear stress test - on medical management with no angina.   DDD (degenerative disc disease)    Hypercholesteremia    Hypertension    PAC (premature atrial contraction)    Pulmonary nodule    a. being followed by PCP, stable 07/2016.   PVC's (premature ventricular contractions)    Sinus bradycardia    Vertigo     Past Surgical History:  Procedure Laterality Date   CARDIAC CATHETERIZATION N/A 08/13/2015   Procedure: Left Heart Cath and Coronary Angiography;  Surgeon: Belva Crome, MD;  Location: [facility];  Service: Cardiovascular;   Laterality: N/A;   TONSILLECTOMY      Current Medications: Current Meds  Medication Sig   aspirin 81 MG tablet Take 81 mg by mouth daily.   Coenzyme Q10 (COQ10 PO) Take 1 capsule by mouth daily.    COVID-19 mRNA vaccine 2023-2024 (COMIRNATY) syringe Inject into the muscle.   COVID-19 mRNA vaccine, Pfizer, 30 MCG/0.3ML injection Inject into the muscle.   influenza vac split quadrivalent PF (FLUARIX) 0.5 ML injection Inject into the muscle.   loratadine (CLARITIN) 10 MG tablet Take 10 mg by mouth daily as needed for allergies.    nebivolol (BYSTOLIC) 2.5 MG tablet Take 1 tablet daily and may take 1 extra tablet as needed for palpitations daily   nitroGLYCERIN (NITROSTAT) 0.4 MG SL tablet PLACE 1 TABLET UNDER THE TONGUE EVERY 5 MINUTES AS NEEDED FOR CHEST PAIN, MAY REPEAT FOR 3 DOSES.   Polyethyl Glycol-Propyl Glycol (SYSTANE OP) Place 2 drops into both eyes as needed (for dry eyes).   rosuvastatin (CRESTOR) 20 MG tablet TAKE ONE TABLET BY MOUTH DAILY.   triamcinolone cream (KENALOG) 0.5 % Apply 1 application topically 2 (two) times daily.     Allergies:   Sudafed [pseudoephedrine hcl], Metoprolol, Buprenorphine hcl, Morphine and related, Allegra [fexofenadine hcl], Doxycycline monohydrate, Lipitor [atorvastatin], Penicillins, Sulfa antibiotics, Sulfacetamide sodium, and Tetracyclines & related   Social  History   Socioeconomic History   Marital status: Single    Spouse name: Not on file   Number of children: 0   Years of education: 75   Highest education level: Not on file  Occupational History   Occupation: Geophysicist/field seismologist  Tobacco Use   Smoking status: Never   Smokeless tobacco: Never  Vaping Use   Vaping Use: Never used  Substance and Sexual Activity   Alcohol use: Yes    Alcohol/week: 0.0 standard drinks of alcohol    Comment: occasional beer and wine   Drug use: No   Sexual activity: Not on file  Other Topics Concern   Not on file  Social History Narrative   Lives with  partner in a one story home.  Has no children.  Works as a Geophysicist/field seismologist.  Education: college.    Social Determinants of Health   Financial Resource Strain: Not on file  Food Insecurity: Not on file  Transportation Needs: Not on file  Physical Activity: Not on file  Stress: Not on file  Social Connections: Not on file     Family History: [name] family history includes CAD in her father; Heart disease in her father; Hypertension in her father; Parkinsonism in her mother.  ROS:   Please see the history of present illness.    ROS  All other systems reviewed and negative.   EKGs/Labs/Other Studies Reviewed:    The following studies were reviewed today: none  EKG:  EKG is not ordered today  Recent Labs: 06/01/2022: ALT 13; BUN 14; Creatinine, Ser 0.82; Hemoglobin 14.3; Platelets 202; Potassium 4.3; Sodium 141; TSH 3.940   Recent Lipid Panel    Component Value Date/Time   CHOL 141 06/01/2022 0737   TRIG 74 06/01/2022 0737   HDL 68 06/01/2022 0737   CHOLHDL 2.1 06/01/2022 0737   CHOLHDL 1.8 05/12/2016 0818   VLDL 11 05/12/2016 0818   LDLCALC 58 06/01/2022 0737    Physical Exam:    VS:  BP 132/60   Pulse 65   Ht 5\' 7"  (1.702 m)   Wt 173 lb 6.4 oz (78.7 kg)   SpO2 98%   BMI 27.16 kg/m     Wt Readings from Last 3 Encounters:  11/23/22 173 lb 6.4 oz (78.7 kg)  06/01/22 176 lb (79.8 kg)  05/29/22 176 lb (79.8 kg)    GEN: Well nourished, well developed in no acute distress HEENT: Normal NECK: No JVD; No carotid bruits LYMPHATICS: No lymphadenopathy CARDIAC:RRR, no murmurs, rubs, gallops with occasional ectopy RESPIRATORY:  Clear to auscultation without rales, wheezing or rhonchi  ABDOMEN: Soft, non-tender, non-distended MUSCULOSKELETAL:  No edema; No deformity  SKIN: Warm and dry NEUROLOGIC:  Alert and oriented x 3 PSYCHIATRIC:  Normal affect  ASSESSMENT:    1. PAT (paroxysmal atrial tachycardia)   2. CAD in native artery   3. Essential hypertension    4. Hyperlipidemia LDL goal <70    PLAN:    In order of problems listed above:  1.  Nonsustained atrial tachycardia -She remains in sinus rhythm.  Her palpitations are well suppressed on beta-blocker therapy -Continue prescription drug management Bystolic 2.5 mg daily with as needed refills   2.  ASCAD  -cath 2016 showed 50% prox to mid LAD, 50% ramus, 40% ostial D1, 45% distal LCX and 30% prox to mid RCA and 75% RPDA with no ischemia in inferior wall on nuclear stress test - on medical management.   -She denies any anginal symptoms but  still has some DOE which I suspect is related to deconditioning>>nuclear stress test 7/23 with no ischemia -I will check a 2D echo due to ongoing SOB -Continue prescription drug management with aspirin 81 mg daily, Bystolic 2.5 mg daily and statin therapy   3.  HTN  -BP is adequately controlled on exam -Continue prescription drug management Bystolic 2.5 mg daily with as needed refills   4.  Hyperlipidemia  - her LDL goal is < 70.   - I have personally reviewed and interpreted outside labs performed by patient's PCP which showed LDL 58 and HDL 68 on 06/01/2022 -Continue prescription drug management with Crestor 20 mg daily with as needed refills  Medication Adjustments/Labs and Tests Ordered: Current medicines are reviewed at length with the patient today.  Concerns regarding medicines are outlined above.  No orders of the defined types were placed in this encounter.  No orders of the defined types were placed in this encounter.   Signed, Armanda Magic, MD  11/23/2022 3:50 PM    [facility]

## 2022-11-23 NOTE — Addendum Note (Signed)
Addended by: Joni Reining on: 11/23/2022 03:56 PM   Modules accepted: Orders

## 2022-11-23 NOTE — Patient Instructions (Signed)
Medication Instructions:  Your physician recommends that you continue on your current medications as directed. Please refer to the Current Medication list given to you today.  *If you need a refill on your cardiac medications before your next appointment, please call your pharmacy*   Lab Work: None.  If you have labs (blood work) drawn today and your tests are completely normal, you will receive your results only by: MyChart Message (if you have MyChart) OR A paper copy in the mail If you have any lab test that is abnormal or we need to change your treatment, we will call you to review the results.   Testing/Procedures: Your physician has requested that you have an echocardiogram. Echocardiography is a painless test that uses sound waves to create images of your heart. It provides your doctor with information about the size and shape of your heart and how well your heart's chambers and valves are working. This procedure takes approximately one hour. There are no restrictions for this procedure. Please do NOT wear cologne, perfume, aftershave, or lotions (deodorant is allowed). Please arrive 15 minutes prior to your appointment time.    Follow-Up:    Your next appointment:   1 year(s)  Provider:   Traci Turner, MD     

## 2022-11-24 ENCOUNTER — Ambulatory Visit (HOSPITAL_COMMUNITY)
Admission: RE | Admit: 2022-11-24 | Discharge: 2022-11-24 | Disposition: A | Discharge status: Home / Self Care | Payer: Medicare Other | Source: Ambulatory Visit | Attending: Physician Assistant | Admitting: Physician Assistant

## 2022-11-24 DIAGNOSIS — R109 Unspecified abdominal pain: Secondary | ICD-10-CM | POA: Diagnosis not present

## 2022-11-24 DIAGNOSIS — K5730 Diverticulosis of large intestine without perforation or abscess without bleeding: Secondary | ICD-10-CM | POA: Diagnosis not present

## 2022-11-24 DIAGNOSIS — R3129 Other microscopic hematuria: Secondary | ICD-10-CM | POA: Diagnosis not present

## 2022-11-24 DIAGNOSIS — D1800 Hemangioma unspecified site: Secondary | ICD-10-CM | POA: Diagnosis not present

## 2022-11-24 DIAGNOSIS — R10A2 Flank pain, left side: Secondary | ICD-10-CM

## 2022-11-24 DIAGNOSIS — K769 Liver disease, unspecified: Secondary | ICD-10-CM | POA: Diagnosis not present

## 2022-12-11 DIAGNOSIS — K080 Exfoliation of teeth due to systemic causes: Secondary | ICD-10-CM | POA: Diagnosis not present

## 2022-12-14 ENCOUNTER — Other Ambulatory Visit (HOSPITAL_COMMUNITY): Payer: Medicare Other

## 2022-12-19 DIAGNOSIS — R3915 Urgency of urination: Secondary | ICD-10-CM | POA: Diagnosis not present

## 2022-12-19 DIAGNOSIS — R1084 Generalized abdominal pain: Secondary | ICD-10-CM | POA: Diagnosis not present

## 2022-12-19 DIAGNOSIS — R3912 Poor urinary stream: Secondary | ICD-10-CM | POA: Diagnosis not present

## 2023-01-02 ENCOUNTER — Ambulatory Visit (HOSPITAL_COMMUNITY): Payer: Medicare Other | Attending: Cardiology

## 2023-01-02 DIAGNOSIS — R0609 Other forms of dyspnea: Secondary | ICD-10-CM | POA: Diagnosis not present

## 2023-01-02 LAB — ECHOCARDIOGRAM COMPLETE
Area-P 1/2: 4.58 cm2
S' Lateral: 2.1 cm

## 2023-01-04 DIAGNOSIS — I493 Ventricular premature depolarization: Secondary | ICD-10-CM | POA: Diagnosis not present

## 2023-01-04 DIAGNOSIS — Z0000 Encounter for general adult medical examination without abnormal findings: Secondary | ICD-10-CM | POA: Diagnosis not present

## 2023-01-04 DIAGNOSIS — I491 Atrial premature depolarization: Secondary | ICD-10-CM | POA: Diagnosis not present

## 2023-01-04 DIAGNOSIS — E7800 Pure hypercholesterolemia, unspecified: Secondary | ICD-10-CM | POA: Diagnosis not present

## 2023-01-04 DIAGNOSIS — R7303 Prediabetes: Secondary | ICD-10-CM | POA: Diagnosis not present

## 2023-01-08 DIAGNOSIS — Z86010 Personal history of colonic polyps: Secondary | ICD-10-CM | POA: Diagnosis not present

## 2023-01-08 DIAGNOSIS — K5900 Constipation, unspecified: Secondary | ICD-10-CM | POA: Diagnosis not present

## 2023-02-06 ENCOUNTER — Ambulatory Visit: Payer: Medicare Other | Admitting: Cardiology

## 2023-02-16 ENCOUNTER — Other Ambulatory Visit: Payer: Self-pay | Admitting: Cardiology

## 2023-03-08 DIAGNOSIS — K080 Exfoliation of teeth due to systemic causes: Secondary | ICD-10-CM | POA: Diagnosis not present

## 2023-03-27 DIAGNOSIS — K5730 Diverticulosis of large intestine without perforation or abscess without bleeding: Secondary | ICD-10-CM | POA: Diagnosis not present

## 2023-03-27 DIAGNOSIS — Z86010 Personal history of colonic polyps: Secondary | ICD-10-CM | POA: Diagnosis not present

## 2023-03-27 DIAGNOSIS — Z09 Encounter for follow-up examination after completed treatment for conditions other than malignant neoplasm: Secondary | ICD-10-CM | POA: Diagnosis not present

## 2023-04-11 DIAGNOSIS — L570 Actinic keratosis: Secondary | ICD-10-CM | POA: Diagnosis not present

## 2023-06-19 DIAGNOSIS — K080 Exfoliation of teeth due to systemic causes: Secondary | ICD-10-CM | POA: Diagnosis not present

## 2023-08-14 DIAGNOSIS — Z1231 Encounter for screening mammogram for malignant neoplasm of breast: Secondary | ICD-10-CM | POA: Diagnosis not present

## 2023-08-18 DIAGNOSIS — Z23 Encounter for immunization: Secondary | ICD-10-CM | POA: Diagnosis not present

## 2023-08-27 ENCOUNTER — Encounter: Payer: Self-pay | Admitting: Cardiology

## 2023-09-04 DIAGNOSIS — G514 Facial myokymia: Secondary | ICD-10-CM | POA: Diagnosis not present

## 2023-09-11 DIAGNOSIS — D3132 Benign neoplasm of left choroid: Secondary | ICD-10-CM | POA: Diagnosis not present

## 2023-10-06 DIAGNOSIS — S61412A Laceration without foreign body of left hand, initial encounter: Secondary | ICD-10-CM | POA: Diagnosis not present

## 2023-10-06 DIAGNOSIS — W268XXA Contact with other sharp object(s), not elsewhere classified, initial encounter: Secondary | ICD-10-CM | POA: Diagnosis not present

## 2023-10-06 DIAGNOSIS — Z23 Encounter for immunization: Secondary | ICD-10-CM | POA: Diagnosis not present

## 2023-11-21 ENCOUNTER — Ambulatory Visit: Payer: Medicare Other | Attending: Cardiology | Admitting: Cardiology

## 2023-11-21 VITALS — BP 120/64 | HR 60 | Ht 67.0 in | Wt 174.0 lb

## 2023-11-21 DIAGNOSIS — E785 Hyperlipidemia, unspecified: Secondary | ICD-10-CM

## 2023-11-21 DIAGNOSIS — I2510 Atherosclerotic heart disease of native coronary artery without angina pectoris: Secondary | ICD-10-CM

## 2023-11-21 DIAGNOSIS — Z79899 Other long term (current) drug therapy: Secondary | ICD-10-CM

## 2023-11-21 DIAGNOSIS — I4719 Other supraventricular tachycardia: Secondary | ICD-10-CM | POA: Diagnosis not present

## 2023-11-21 DIAGNOSIS — I10 Essential (primary) hypertension: Secondary | ICD-10-CM

## 2023-11-21 DIAGNOSIS — R079 Chest pain, unspecified: Secondary | ICD-10-CM

## 2023-11-21 DIAGNOSIS — R0609 Other forms of dyspnea: Secondary | ICD-10-CM

## 2023-11-21 DIAGNOSIS — R911 Solitary pulmonary nodule: Secondary | ICD-10-CM

## 2023-11-21 NOTE — Addendum Note (Signed)
 Addended by: Cherylyn Cos on: 11/21/2023 09:49 AM   Modules accepted: Orders

## 2023-11-21 NOTE — Addendum Note (Signed)
 Addended by: Cherylyn Cos on: 11/21/2023 09:50 AM   Modules accepted: Orders

## 2023-11-21 NOTE — Progress Notes (Signed)
 Cardiology Office Note:    Date:  11/21/2023   ID:  Maria Cochran, DOB 1956/10/28, MRN [id number]  PCP:  Jannelle Memory, MD (Inactive)  Cardiologist:  Gaylyn Keas, MD    Referring MD: Jannelle Memory, MD   Chief Complaint  Patient presents with   Coronary Artery Disease   Hypertension   Hyperlipidemia    History of Present Illness:    Maria Cochran is a 68 y.o. female with a hx of PVC's and nonsustained atrial tachycardia, dyslipidemia and borderline obstructive ASCAD of the RPDA of 75% with no ischemia on nuclear stress test and nonobstructive elsewhere by cath on medical management.  Event monitor 3/23 showed no arrhythmias. She had a nuclear stress test in 7/23 for SOB that showed no ischemia and normal LVF.  Hbg and TSH were normal in July 23.   She is here today for followup and is doing well.  She continues to have SOB of unclear etiology.  She has a known pulmonary nodule noted on a chest CT 2016 and followed by her PCP Dr. Euel Herring who retired last year.  She denies any PND, orthopnea, LE edema, dizziness, palpitations or syncope. She occasionally has a twinge of chest discomfort that she describes as a "soreness" over her entire chest  with no associated sx of nausea, SOB or diaphoresis. It only occurs with exertion. She is compliant with her meds and is tolerating meds with no SE.    Past Medical History:  Diagnosis Date   Atrial tachycardia, paroxysmal    Colonic polyp    Coronary artery disease 2016   50% prox to mid LAD, 50% ramus, 40% ostial D1, 45% distal LCX and 30% prox to mid RCA and 75% RPDA with no ischemia in inferior wall on nuclear stress test - on medical management with no angina.   DDD (degenerative disc disease)    Hypercholesteremia    Hypertension    PAC (premature atrial contraction)    Pulmonary nodule    a. being followed by PCP, stable 07/2016.   PVC's (premature ventricular contractions)    Sinus bradycardia    Vertigo     Past Surgical History:   Procedure Laterality Date   CARDIAC CATHETERIZATION N/A 08/13/2015   Procedure: Left Heart Cath and Coronary Angiography;  Surgeon: Arty Binning, MD;  Location: [facility];  Service: Cardiovascular;  Laterality: N/A;   TONSILLECTOMY      Current Medications: Current Meds  Medication Sig   aspirin  81 MG tablet Take 81 mg by mouth daily.   Coenzyme Q10 (COQ10 PO) Take 1 capsule by mouth daily.    COVID-19 mRNA vaccine 2023-2024 (COMIRNATY ) syringe Inject into the muscle.   COVID-19 mRNA vaccine, Pfizer, 30 MCG/0.3ML injection Inject into the muscle.   influenza vac split quadrivalent PF (FLUARIX) 0.5 ML injection Inject into the muscle.   loratadine (CLARITIN) 10 MG tablet Take 10 mg by mouth daily as needed for allergies.    nebivolol  (BYSTOLIC ) 2.5 MG tablet Take 1 tablet daily and may take 1 extra tablet as needed for palpitations daily   nitroGLYCERIN  (NITROSTAT ) 0.4 MG SL tablet PLACE 1 TABLET UNDER THE TONGUE EVERY 5 MINUTES AS NEEDED FOR CHEST PAIN, MAY REPEAT FOR 3 DOSES.   Polyethyl Glycol-Propyl Glycol (SYSTANE OP) Place 2 drops into both eyes as needed (for dry eyes).   rosuvastatin  (CRESTOR ) 20 MG tablet TAKE ONE TABLET BY MOUTH DAILY.   triamcinolone cream (KENALOG) 0.5 % Apply 1 application topically  2 (two) times daily.     Allergies:   Sudafed [pseudoephedrine hcl], Metoprolol, Buprenorphine hcl, Morphine and codeine, Allegra [fexofenadine hcl], Doxycycline monohydrate, Lipitor [atorvastatin ], Penicillins, Sulfa antibiotics, Sulfacetamide sodium, and Tetracyclines & related   Social History   Socioeconomic History   Marital status: Single    Spouse name: Not on file   Number of children: 0   Years of education: 16   Highest education level: Not on file  Occupational History   Occupation: Environmental manager  Tobacco Use   Smoking status: Never   Smokeless tobacco: Never  Vaping Use   Vaping status: Never Used  Substance and Sexual Activity   Alcohol use: Yes     Alcohol/week: 0.0 standard drinks of alcohol    Comment: occasional beer and wine   Drug use: No   Sexual activity: Not on file  Other Topics Concern   Not on file  Social History Narrative   Lives with partner in a one story home.  Has no children.  Works as a Environmental manager.  Education: college.    Social Drivers of Corporate investment banker Strain: Not on file  Food Insecurity: Not on file  Transportation Needs: Not on file  Physical Activity: Not on file  Stress: Not on file  Social Connections: Not on file     Family History: [name] family history includes CAD in her father; Heart disease in her father; Hypertension in her father; Parkinsonism in her mother.  ROS:   Please see the history of present illness.    ROS  All other systems reviewed and negative.   EKGs/Labs/Other Studies Reviewed:    The following studies were reviewed today:   Recent Labs: No results found for requested labs within last 365 days.   Recent Lipid Panel    Component Value Date/Time   CHOL 141 06/01/2022 0737   TRIG 74 06/01/2022 0737   HDL 68 06/01/2022 0737   CHOLHDL 2.1 06/01/2022 0737   CHOLHDL 1.8 05/12/2016 0818   VLDL 11 05/12/2016 0818   LDLCALC 58 06/01/2022 0737    Physical Exam:    VS:  BP 120/64   Pulse 60   Ht 5\' 7"  (1.702 m)   Wt 174 lb (78.9 kg)   SpO2 98%   BMI 27.25 kg/m     Wt Readings from Last 3 Encounters:  11/21/23 174 lb (78.9 kg)  11/23/22 173 lb 6.4 oz (78.7 kg)  06/01/22 176 lb (79.8 kg)    GEN: Well nourished, well developed in no acute distress HEENT: Normal NECK: No JVD; No carotid bruits LYMPHATICS: No lymphadenopathy CARDIAC:RRR, no murmurs, rubs, gallops RESPIRATORY:  Clear to auscultation without rales, wheezing or rhonchi  ABDOMEN: Soft, non-tender, non-distended MUSCULOSKELETAL:  No edema; No deformity  SKIN: Warm and dry NEUROLOGIC:  Alert and oriented x 3 PSYCHIATRIC:  Normal affect  ASSESSMENT:    1. PAT  (paroxysmal atrial tachycardia) (HCC)   2. CAD in native artery   3. Essential hypertension   4. Hyperlipidemia LDL goal <70    PLAN:    In order of problems listed above:  1.  Nonsustained atrial tachycardia -She is maintaining normal sinus rhythm on exam today and denies any palpitations -Continue prescription Drug Management with Bystolic  2.5 Mg Daily with As Needed Refills   2.  ASCAD/SOB/chest pain -cath 2016 showed 50% prox to mid LAD, 50% ramus, 40% ostial D1, 45% distal LCX and 30% prox to mid RCA and 75% RPDA with no ischemia  in inferior wall on nuclear stress test - on medical management.   -nuclear stress test done for shortness of breath 7/23 with no ischemia -2D echo 12/26/2022 showed EF 60 to 65% with grade 2 diastolic dysfunction and RAP 3 mmHg -She has recently been having some exertional CP and I think we need to get a Stress PET CT to assess further. -Informed Consent   Shared Decision Making/Informed Consent The risks [chest pain, shortness of breath, cardiac arrhythmias, dizziness, blood pressure fluctuations, myocardial infarction, stroke/transient ischemic attack, nausea, vomiting, allergic reaction, radiation exposure, metallic taste sensation and life-threatening complications (estimated to be 1 in 10,000)], benefits (risk stratification, diagnosing coronary artery disease, treatment guidance) and alternatives of a cardiac PET stress test were discussed in detail with Ms. Stiff and she agrees to proceed. -If stress PET CT shows not ischemia then may need cardiopulmonary stress test to try to sort out etiology of persistent SOB -Informed Consent   Shared Decision Making/Informed Consent The risks [chest pain, shortness of breath, cardiac arrhythmias, dizziness, blood pressure fluctuations, myocardial infarction, stroke/transient ischemic attack, and life-threatening complications (estimated to be 1 in 10,000)], benefits (risk stratification, diagnosing coronary artery  disease, treatment guidance) and alternatives of an exercise tolerance test were discussed in detail with Ms. Trainer and she agrees to proceed.    -Continue prescription drug managed with aspirin  81 mg daily, Bystolic  2.5 mg daily and Crestor  20 mg daily with as needed refills   3.  HTN  -BP controlled on exam today -Continue prescription drug management with Bystolic  2.5 mg daily with as needed refills   4.  Hyperlipidemia  - her LDL goal is < 70.   - I have personally reviewed and interpreted outside labs performed by patient's PCP which showed LDL 58 and HDL 68 on 06/01/2022 -Repeat fasting lipid panel and ALT -Continue prescription manage with Crestor  20 mg daily with as needed refills  5.  Pulmonary nodule -first noted in 2016 and records sent to her PCP to followup on.  She says that he mentioned it but she has never had a followup CT -will be able to assess on her stress PET CT  Medication Adjustments/Labs and Tests Ordered: Current medicines are reviewed at length with the patient today.  Concerns regarding medicines are outlined above.  Orders Placed This Encounter  Procedures   EKG 12-Lead   No orders of the defined types were placed in this encounter.   Signed, Gaylyn Keas, MD  11/21/2023 9:34 AM    [facility]

## 2023-11-21 NOTE — Addendum Note (Signed)
 Addended by: Cherylyn Cos on: 11/21/2023 09:48 AM   Modules accepted: Orders

## 2023-11-21 NOTE — Patient Instructions (Signed)
 Medication Instructions:  Your physician recommends that you continue on your current medications as directed. Please refer to the Current Medication list given to you today.  *If you need a refill on your cardiac medications before your next appointment, please call your pharmacy*   Lab Work: Please complete a FASTING lipid panel and an ALT today at the first floor [facility] before you leave today.  If you have labs (blood work) drawn today and your tests are completely normal, you will receive your results only by: MyChart Message (if you have MyChart) OR A paper copy in the mail If you have any lab test that is abnormal or we need to change your treatment, we will call you to review the results.   Testing/Procedures:    Please report to Radiology at the [facility] Main Entrance 30 minutes early for your test.  [address] [place], [place] [address]                           How to Prepare for Your Cardiac PET/CT Stress Test:  Nothing to eat or drink, except water, 3 hours prior to arrival time.  NO caffeine/decaffeinated products, or chocolate 12 hours prior to arrival. (Please note decaffeinated beverages (teas/coffees) still contain caffeine).  If you have caffeine within 12 hours prior, the test will need to be rescheduled.  Medication instructions: Do not take nitrates (isosorbide mononitrate, Ranexa) the day before or day of test Do not take tamsulosin the day before or morning of test Hold theophylline containing medications for 12 hours. Hold Dipyridamole 48 hours prior to the test.  You may take your remaining medications with water.  NO perfume, cologne or lotion on chest or abdomen area. FEMALES - Please avoid wearing dresses to this appointment.  Total time is 1 to 2 hours; you may want to bring reading material for the waiting time.  IF YOU THINK YOU MAY BE PREGNANT, OR ARE NURSING PLEASE INFORM THE TECHNOLOGIST.  In preparation for your  appointment, medication and supplies will be purchased.  Appointment availability is limited, so if you need to cancel or reschedule, please call the Radiology Department at [phone] Maryan Smalling) OR [phone] [facility]) 24 hours in advance to avoid a cancellation fee of $100.00  What to Expect When you Arrive:  Once you arrive and check in for your appointment, you will be taken to a preparation room within the Radiology Department.  A technologist or Nurse will obtain your medical history, verify that you are correctly prepped for the exam, and explain the procedure.  Afterwards, an IV will be started in your arm and electrodes will be placed on your skin for EKG monitoring during the stress portion of the exam. Then you will be escorted to the PET/CT scanner.  There, staff will get you positioned on the scanner and obtain a blood pressure and EKG.  During the exam, you will continue to be connected to the EKG and blood pressure machines.  A small, safe amount of a radioactive tracer will be injected in your IV to obtain a series of pictures of your heart along with an injection of a stress agent.    After your Exam:  It is recommended that you eat a meal and drink a caffeinated beverage to counter act any effects of the stress agent.  Drink plenty of fluids for the remainder of the day and urinate frequently for the first couple of  hours after the exam.  Your doctor will inform you of your test results within 7-10 business days.  For more information and frequently asked questions, please visit our website: [email or web address]  For questions about your test or how to prepare for your test, please call: Cardiac Imaging Nurse Navigators Office: [phone]    Follow-Up: At [facility], you and your health needs are our priority.  As part of our continuing mission to provide you with exceptional heart care, we have created designated Provider Care Teams.  These Care  Teams include your primary Cardiologist (physician) and Advanced Practice Providers (APPs -  Physician Assistants and Nurse Practitioners) who all work together to provide you with the care you need, when you need it.  We recommend signing up for the patient portal called "MyChart".  Sign up information is provided on this After Visit Summary.  MyChart is used to connect with patients for Virtual Visits (Telemedicine).  Patients are able to view lab/test results, encounter notes, upcoming appointments, etc.  Non-urgent messages can be sent to your provider as well.   To learn more about what you can do with MyChart, go to [email or web address].    Your next appointment:   1 year(s)  Provider:   Gaylyn Keas, MD

## 2023-11-22 DIAGNOSIS — G514 Facial myokymia: Secondary | ICD-10-CM | POA: Diagnosis not present

## 2023-11-22 DIAGNOSIS — Z820 Family history of epilepsy and other diseases of the nervous system: Secondary | ICD-10-CM | POA: Diagnosis not present

## 2023-11-22 DIAGNOSIS — E785 Hyperlipidemia, unspecified: Secondary | ICD-10-CM | POA: Diagnosis not present

## 2023-11-22 DIAGNOSIS — R200 Anesthesia of skin: Secondary | ICD-10-CM | POA: Diagnosis not present

## 2023-11-22 DIAGNOSIS — Z79899 Other long term (current) drug therapy: Secondary | ICD-10-CM | POA: Diagnosis not present

## 2023-11-22 LAB — ALT: ALT: 19 [IU]/L (ref 0–32)

## 2023-11-22 LAB — LIPID PANEL
Chol/HDL Ratio: 2 {ratio} (ref 0.0–4.4)
Cholesterol, Total: 160 mg/dL (ref 100–199)
HDL: 80 mg/dL (ref >39)
LDL Chol Calc (NIH): 69 mg/dL (ref 0–99)
Triglycerides: 51 mg/dL (ref 0–149)
VLDL Cholesterol Cal: 11 mg/dL (ref 5–40)

## 2023-11-23 ENCOUNTER — Other Ambulatory Visit: Payer: Self-pay | Admitting: Internal Medicine

## 2023-11-23 DIAGNOSIS — R200 Anesthesia of skin: Secondary | ICD-10-CM

## 2023-11-27 ENCOUNTER — Ambulatory Visit
Admission: RE | Admit: 2023-11-27 | Discharge: 2023-11-27 | Disposition: A | Discharge status: Home / Self Care | Payer: Medicare Other | Source: Ambulatory Visit | Attending: Internal Medicine

## 2023-11-27 DIAGNOSIS — R200 Anesthesia of skin: Secondary | ICD-10-CM

## 2023-11-27 DIAGNOSIS — R9089 Other abnormal findings on diagnostic imaging of central nervous system: Secondary | ICD-10-CM | POA: Diagnosis not present

## 2023-11-28 NOTE — Addendum Note (Signed)
Addended by: Quintella Reichert on: 11/28/2023 10:18 AM   Modules accepted: Orders

## 2023-12-14 ENCOUNTER — Other Ambulatory Visit: Payer: Self-pay | Admitting: Cardiology

## 2023-12-17 ENCOUNTER — Encounter (HOSPITAL_COMMUNITY): Payer: Self-pay

## 2023-12-18 ENCOUNTER — Telehealth (HOSPITAL_COMMUNITY): Payer: Self-pay | Admitting: *Deleted

## 2023-12-18 NOTE — Telephone Encounter (Signed)
Reaching out to patient to offer assistance regarding upcoming cardiac imaging study; pt verbalizes understanding of appt date/time, parking situation and where to check in, pre-test NPO status and verified current allergies; name and call back number provided for further questions should they arise  Larey Brick RN Navigator Cardiac Imaging [facility] [phone] office [phone] cell  Patient aware to avoid caffeine 12 hours prior to her cardiac PET scan.

## 2023-12-19 ENCOUNTER — Encounter (HOSPITAL_COMMUNITY)
Admission: RE | Admit: 2023-12-19 | Discharge: 2023-12-19 | Disposition: A | Discharge status: Home / Self Care | Payer: Medicare Other | Source: Ambulatory Visit | Attending: Cardiology | Admitting: Cardiology

## 2023-12-19 DIAGNOSIS — R079 Chest pain, unspecified: Secondary | ICD-10-CM | POA: Insufficient documentation

## 2023-12-19 MED ORDER — RUBIDIUM RB82 GENERATOR (RUBYFILL)
20.3000 | PACK | Freq: Once | INTRAVENOUS | Status: AC
  Administered 2023-12-19: 20.3 via INTRAVENOUS

## 2023-12-19 MED ORDER — REGADENOSON 0.4 MG/5ML IV SOLN
INTRAVENOUS | Status: AC
  Filled 2023-12-19: qty 5

## 2023-12-19 MED ORDER — REGADENOSON 0.4 MG/5ML IV SOLN
0.4000 mg | Freq: Once | INTRAVENOUS | Status: AC
  Administered 2023-12-19: 0.4 mg via INTRAVENOUS

## 2023-12-19 MED ORDER — RUBIDIUM RB82 GENERATOR (RUBYFILL)
20.8100 | PACK | Freq: Once | INTRAVENOUS | Status: AC
  Administered 2023-12-19: 20.81 via INTRAVENOUS

## 2023-12-20 ENCOUNTER — Encounter: Payer: Self-pay | Admitting: Cardiology

## 2023-12-20 LAB — NM PET CT CARDIAC PERFUSION MULTI W/ABSOLUTE BLOODFLOW
LV dias vol: 68 mL (ref 46–106)
LV sys vol: 17 mL
MBFR: 3.7
Nuc Rest EF: 62 %
Nuc Stress EF: 75 %
Peak HR: 100 {beats}/min
Rest HR: 63 {beats}/min
Rest MBF: 0.76 ml/g/min
Rest Nuclear Isotope Dose: 20.8 mCi
ST Depression (mm): 0 mm
Stress MBF: 2.81 ml/g/min
Stress Nuclear Isotope Dose: 20.3 mCi
TID: 1

## 2023-12-21 ENCOUNTER — Telehealth: Payer: Self-pay

## 2023-12-21 NOTE — Telephone Encounter (Signed)
-----   Message from Armanda Magic sent at 12/20/2023  8:44 PM EST ----- Please let patient know that stress test was fine

## 2023-12-21 NOTE — Telephone Encounter (Signed)
Call to patient to discuss stress test results. Per Dr. Mayford Knife, stress portion of stress/PET/CT is normal. Non cardiac portion of stress test showed no acute disease but did show coronary artery calcifiations, which patient states she has been aware of for some time.

## 2024-01-08 DIAGNOSIS — I2510 Atherosclerotic heart disease of native coronary artery without angina pectoris: Secondary | ICD-10-CM | POA: Diagnosis not present

## 2024-01-08 DIAGNOSIS — Z0000 Encounter for general adult medical examination without abnormal findings: Secondary | ICD-10-CM | POA: Diagnosis not present

## 2024-01-08 DIAGNOSIS — E559 Vitamin D deficiency, unspecified: Secondary | ICD-10-CM | POA: Diagnosis not present

## 2024-01-08 DIAGNOSIS — Z820 Family history of epilepsy and other diseases of the nervous system: Secondary | ICD-10-CM | POA: Diagnosis not present

## 2024-01-08 DIAGNOSIS — I491 Atrial premature depolarization: Secondary | ICD-10-CM | POA: Diagnosis not present

## 2024-01-08 DIAGNOSIS — I493 Ventricular premature depolarization: Secondary | ICD-10-CM | POA: Diagnosis not present

## 2024-01-08 DIAGNOSIS — E7800 Pure hypercholesterolemia, unspecified: Secondary | ICD-10-CM | POA: Diagnosis not present

## 2024-01-08 DIAGNOSIS — R7303 Prediabetes: Secondary | ICD-10-CM | POA: Diagnosis not present

## 2024-01-08 DIAGNOSIS — G514 Facial myokymia: Secondary | ICD-10-CM | POA: Diagnosis not present

## 2024-02-13 ENCOUNTER — Other Ambulatory Visit: Payer: Self-pay | Admitting: Cardiology

## 2024-02-26 DIAGNOSIS — N958 Other specified menopausal and perimenopausal disorders: Secondary | ICD-10-CM | POA: Diagnosis not present

## 2024-02-26 DIAGNOSIS — E2839 Other primary ovarian failure: Secondary | ICD-10-CM | POA: Diagnosis not present

## 2024-03-04 DIAGNOSIS — K080 Exfoliation of teeth due to systemic causes: Secondary | ICD-10-CM | POA: Diagnosis not present

## 2024-03-05 DIAGNOSIS — I700 Atherosclerosis of aorta: Secondary | ICD-10-CM | POA: Insufficient documentation

## 2024-03-05 DIAGNOSIS — I10 Essential (primary) hypertension: Secondary | ICD-10-CM | POA: Insufficient documentation

## 2024-03-05 DIAGNOSIS — E559 Vitamin D deficiency, unspecified: Secondary | ICD-10-CM | POA: Insufficient documentation

## 2024-03-05 DIAGNOSIS — Z820 Family history of epilepsy and other diseases of the nervous system: Secondary | ICD-10-CM | POA: Insufficient documentation

## 2024-03-10 ENCOUNTER — Ambulatory Visit: Payer: Medicare Other | Admitting: Neurology

## 2024-03-10 ENCOUNTER — Encounter: Payer: Self-pay | Admitting: Neurology

## 2024-03-10 VITALS — BP 138/75 | HR 81 | Ht 67.0 in | Wt 178.0 lb

## 2024-03-10 DIAGNOSIS — G5132 Clonic hemifacial spasm, left: Secondary | ICD-10-CM | POA: Insufficient documentation

## 2024-03-10 NOTE — Progress Notes (Signed)
 Chief Complaint  Patient presents with   New Patient (Initial Visit)    Pt in 15, here alone  Pt is referred for facial twitching. Pt states the twitching happens next to her mouth on the left side.       ASSESSMENT AND PLAN  Maria Cochran is a 68 y.o. female   Left hemifacial spasm,  Intermittent I did not see anything in muscle twitching today, essentially normal neurological examination,  Discussed with patient, decided to continue observe for symptoms, she will reach out to office again if she has frequent muscle twitching, may consider low-dose EMG guided Xeomin injection then  DIAGNOSTIC DATA (LABS, IMAGING, TESTING) - I reviewed patient records, labs, notes, testing and imaging myself where available.   MEDICAL HISTORY:  Maria Cochran, is a 68 year old female who seen in request by primary care from [facility] Dr. Lilyan Remedies, Rinka R, for evaluation of left hemifacial spasm, initial evaluation Mar 10, 2024    History is obtained from the patient and review of electronic medical records. I personally reviewed pertinent available imaging films in PACS.   PMHx of  HTN HLD  Since October 2024, she began to notice frequent left lower face muscle twitching, can present up to 50% of her day, no interruption in her function, no pain, but can be bothersome, is improved since March 2025, I did not notice any significant muscle twitching today, in specific, she denies visual change no dysarthria no dysphagia,  Her brother at age 7 was diagnosed with ALS recently, this has caused her concern of her symptoms  Lab in Oct 2024, CMP, Glucose 149, Vit D 27.3, Mag 2.3  PHYSICAL EXAM:   Vitals:   03/10/24 0834  BP: 138/75  Pulse: 81  Weight: 178 lb (80.7 kg)  Height: 5\' 7"  (1.702 m)     Body mass index is 27.88 kg/m.  PHYSICAL EXAMNIATION:  Gen: NAD, conversant, well nourised, well groomed                     Cardiovascular: Regular rate rhythm, no peripheral edema, warm,  nontender. Eyes: Conjunctivae clear without exudates or hemorrhage Neck: Supple, no carotid bruits. Pulmonary: Clear to auscultation bilaterally   NEUROLOGICAL EXAM:  MENTAL STATUS: Speech/cognition: Awake, alert, oriented to history taking and casual conversation CRANIAL NERVES: CN II: Visual fields are full to confrontation. Pupils are round equal and briskly reactive to light.   CN III, IV, VI: extraocular movement are normal. No ptosis. CN V: Facial sensation is intact to light touch, Bilateral corneal reflexes were normal and symmetric. CN VII: Face is symmetric with normal eye closure  CN VIII: Hearing is normal to causal conversation. CN IX, X: Phonation is normal. CN XI: Head turning and shoulder shrug are intact  MOTOR: There is no pronator drift of out-stretched arms. Muscle bulk and tone are normal. Muscle strength is normal.  REFLEXES: Reflexes are 2+ and symmetric at the biceps, triceps, knees, and ankles. Plantar responses are flexor.  SENSORY: Intact to light touch, pinprick and vibratory sensation are intact in fingers and toes.  COORDINATION: There is no trunk or limb dysmetria noted.  GAIT/STANCE: Posture is normal. Gait is steady with normal steps, base, arm swing, and turning. Heel and toe walking are normal. Tandem gait is normal.  Romberg is absent.  REVIEW OF SYSTEMS:  Full 14 system review of systems performed and notable only for as above All other review of systems were negative.   ALLERGIES: Allergies  Allergen Reactions   Sudafed [Pseudoephedrine Hcl] Other (See Comments)    Heart rate goes really fast    Metoprolol Other (See Comments)    Severe fatigue    Buprenorphine Hcl Other (See Comments)    Nausea and severe anxiety   Morphine And Codeine Other (See Comments)    Nausea and severe anxiety   Allegra [Fexofenadine Hcl] Other (See Comments)    achey    Doxycycline Monohydrate Swelling   Lipitor [Atorvastatin ] Other (See Comments)     Muscle aches, fatigue, swelling, muscle twitching   Penicillins Swelling   Sulfa Antibiotics Itching   Sulfacetamide Sodium Itching   Tetracyclines & Related Swelling    HOME MEDICATIONS: Current Outpatient Medications  Medication Sig Dispense Refill   aspirin  81 MG tablet Take 81 mg by mouth daily.     Coenzyme Q10 (COQ10 PO) Take 1 capsule by mouth daily.      COVID-19 mRNA vaccine 2023-2024 (COMIRNATY ) syringe Inject into the muscle. 0.3 mL 0   COVID-19 mRNA vaccine, Pfizer, 30 MCG/0.3ML injection Inject into the muscle. 0.3 mL 0   influenza vac split quadrivalent PF (FLUARIX) 0.5 ML injection Inject into the muscle. 0.5 mL 0   loratadine (CLARITIN) 10 MG tablet Take 10 mg by mouth daily as needed for allergies.      nebivolol  (BYSTOLIC ) 2.5 MG tablet Take 1 tablet daily and may take 1 extra tablet as needed for palpitations daily 180 tablet 2   nitroGLYCERIN  (NITROSTAT ) 0.4 MG SL tablet PLACE 1 TABLET UNDER THE TONGUE EVERY 5 MINUTES AS NEEDED FOR CHEST PAIN, MAY REPEAT FOR 3 DOSES. 25 tablet 11   Polyethyl Glycol-Propyl Glycol (SYSTANE OP) Place 2 drops into both eyes as needed (for dry eyes).     rosuvastatin  (CRESTOR ) 20 MG tablet TAKE ONE TABLET BY MOUTH DAILY. 90 tablet 2   No current facility-administered medications for this visit.    PAST MEDICAL HISTORY: Past Medical History:  Diagnosis Date   Atrial tachycardia, paroxysmal (HCC)    Colonic polyp    Coronary artery disease 2016   50% prox to mid LAD, 50% ramus, 40% ostial D1, 45% distal LCX and 30% prox to mid RCA and 75% RPDA with no ischemia in inferior wall on nuclear stress test - on medical management.  Stress PET CT 12/2023 with no ischemia and normal myocardial blood flow reserve   DDD (degenerative disc disease)    Hypercholesteremia    Hypertension    PAC (premature atrial contraction)    Pulmonary nodule    a. being followed by PCP, stable 07/2016.   PVC's (premature ventricular contractions)    Sinus  bradycardia    Vertigo     PAST SURGICAL HISTORY: Past Surgical History:  Procedure Laterality Date   CARDIAC CATHETERIZATION N/A 08/13/2015   Procedure: Left Heart Cath and Coronary Angiography;  Surgeon: Arty Binning, MD;  Location: [facility] INVASIVE CV LAB;  Service: Cardiovascular;  Laterality: N/A;   TONSILLECTOMY      FAMILY HISTORY: Family History  Problem Relation Age of Onset   Hypertension Father    CAD Father    Heart disease Father    Parkinsonism Mother     SOCIAL HISTORY: Social History   Socioeconomic History   Marital status: Single    Spouse name: Not on file   Number of children: 0   Years of education: 16   Highest education level: Not on file  Occupational History   Occupation: Environmental manager  Tobacco Use  Smoking status: Never   Smokeless tobacco: Never  Vaping Use   Vaping status: Never Used  Substance and Sexual Activity   Alcohol use: Yes    Alcohol/week: 2.0 standard drinks of alcohol    Types: 2 Glasses of wine per week    Comment: occasional beer and wine   Drug use: No   Sexual activity: Not on file  Other Topics Concern   Not on file  Social History Narrative   Lives with partner in a one story home.  Has no children.  Works as a Environmental manager.  Education: college.    Social Drivers of Corporate investment banker Strain: Not on file  Food Insecurity: Not on file  Transportation Needs: Not on file  Physical Activity: Not on file  Stress: Not on file  Social Connections: Not on file  Intimate Partner Violence: Not on file      Phebe Brasil, M.D. Ph.D.  [facility] [address] [place], [place] [address] Ph: [phone] Fax: [phone]  CC:  Elester Grim, MD [address] [place],  [place] [address]  Elester Grim, MD

## 2024-05-14 DIAGNOSIS — R197 Diarrhea, unspecified: Secondary | ICD-10-CM | POA: Diagnosis not present

## 2024-05-14 DIAGNOSIS — I10 Essential (primary) hypertension: Secondary | ICD-10-CM | POA: Diagnosis not present

## 2024-05-14 DIAGNOSIS — E7800 Pure hypercholesterolemia, unspecified: Secondary | ICD-10-CM | POA: Diagnosis not present

## 2024-05-14 DIAGNOSIS — R109 Unspecified abdominal pain: Secondary | ICD-10-CM | POA: Diagnosis not present

## 2024-05-29 DIAGNOSIS — N950 Postmenopausal bleeding: Secondary | ICD-10-CM | POA: Diagnosis not present

## 2024-05-29 DIAGNOSIS — E7800 Pure hypercholesterolemia, unspecified: Secondary | ICD-10-CM | POA: Diagnosis not present

## 2024-05-29 DIAGNOSIS — I10 Essential (primary) hypertension: Secondary | ICD-10-CM | POA: Diagnosis not present

## 2024-06-18 DIAGNOSIS — N950 Postmenopausal bleeding: Secondary | ICD-10-CM | POA: Diagnosis not present

## 2024-07-17 ENCOUNTER — Other Ambulatory Visit: Payer: Self-pay

## 2024-07-17 ENCOUNTER — Other Ambulatory Visit (HOSPITAL_COMMUNITY): Payer: Self-pay

## 2024-07-17 DIAGNOSIS — I10 Essential (primary) hypertension: Secondary | ICD-10-CM | POA: Diagnosis not present

## 2024-07-17 DIAGNOSIS — E7800 Pure hypercholesterolemia, unspecified: Secondary | ICD-10-CM | POA: Diagnosis not present

## 2024-07-17 DIAGNOSIS — I2510 Atherosclerotic heart disease of native coronary artery without angina pectoris: Secondary | ICD-10-CM | POA: Diagnosis not present

## 2024-07-17 DIAGNOSIS — U071 COVID-19: Secondary | ICD-10-CM | POA: Diagnosis not present

## 2024-07-17 DIAGNOSIS — R7303 Prediabetes: Secondary | ICD-10-CM | POA: Diagnosis not present

## 2024-07-17 MED ORDER — NIRMATRELVIR&RITONAVIR 300/100 20 X 150 MG & 10 X 100MG PO TBPK
3.0000 | ORAL_TABLET | Freq: Two times a day (BID) | ORAL | 0 refills | Status: DC
  Filled 2024-07-17: qty 30, 5d supply, fill #0

## 2024-08-14 ENCOUNTER — Telehealth: Payer: Self-pay | Admitting: Cardiology

## 2024-08-14 NOTE — Telephone Encounter (Signed)
 S/W patient and her heart rate average has been 60's to 70's. She can feel it fluttering in chest.   And when doing increased activity- After weed eating Sunday- she felt a pain in left side of chest- almost like a muscle spasm/cramp (Monday afternoon) on and off throughout the day- lasting a few seconds at a time. She reports that it was inconsistent. Rates it about 1/10 on pain scale. No other symptoms during the muscles cramps  She did not feel it at all on Tuesday. She reports having an upset stomach on Tuesday- some diarrhea. (Reports recently having Covid and recently going back on probiotics, may have caused the diarrhea)  She was off of Rosuvastatin  for several days when had Covid due to taking an antiviral From 9/11-9/24.  She reports that her anxiety is much higher, she feels very tense; maybe some chest tightness. She just feels overall does not feel well overall. No chest pain, shortness of breath, nausea, dizziness, or sweating at this time. No fever or chills and she is able to take a breath easily and without pain  She reports drinking plenty of fluids and eating well.   If you experience active Chest Pain, including tightness, pressure, jaw pain, radiating pain to shoulder/upper arm/back, Chest Pain unrelieved by 2-3 doses of Nitroglycerin , with or without Shortness Of Breath, nausea, vomiting, and/or sweating- THEN CALL 911 AND GO TO THE NEAREST EMERGENCY ROOM  Appt made with APP for 08/19/24. Given location of appt. She verbalized understanding of all information.

## 2024-08-14 NOTE — Telephone Encounter (Signed)
 Pt has been having more irregular heart rate than usual. Her blood pressure machine has been giving an irregular beat message often. Pt has been very anxious and would like to talk about her symptoms. Please advise.

## 2024-08-18 NOTE — Progress Notes (Unsigned)
 Cardiology Office Note    Date:  08/19/2024  ID:  Maria Cochran, DOB 12/15/1955, MRN [id number] PCP:  Vernon Velna SAUNDERS, MD  Cardiologist:  Wilbert Bihari, MD  Electrophysiologist:  None   Chief Complaint: f/u chest discomfort dyspnea  History of Present Illness: .    Maria Cochran is a 68 y.o. female with visit-pertinent history of PVCs, PACs, nonsustained atrial tachycardia, baseline sinus bradycardia, dyslipidemia, mild-moderate CAD, HTN, vertigo, sinking spells of unclear etiology, trivial MR by echo 2024 who presents for follow-up.   Maria Cochran has been previously followed for history of CAD, palpitations and sinking spells without clear cut cardiac etiology.    Regarding CAD:  - LHC 08/2015 prompted by abnormal coronary CTA showed 50% ramus, 50% prox-mid LAD, 40% D1, 45% Cx, 30% prox-mid RCA, 75% RPDA - Episodic interim stress tests, the last of which was 12/2023 (cardiac PET) which was low risk, no ischemia, no TID, + coronary calcification - last echo 12/2022 EF 60-65%, G2DD, mild-moderately dilated LA, trivial MR   Regarding palpitations/sinking spells:  - remote monitor 2016 for palpitations NSR, occasional PACs/PVCs and nonsustained atrial tachycardia - repeat monitor 02/2018 for sinking spells showed no arrhythmias - repeat monitor 01/2020 showed avg 69bpm, range 48-140bpm, rare PVCs, frequent PACs, bigeminal PACs, and nonsustained atrial tachycardia. Patient triggers were associated with NSR, occasional PACs, and 1 episode of brief a-tach - repeat monitor 01/2022 that was felt to be normal with average HR 70bpm, range 54-123bpm, rare PAC -> she did not recall if symptoms during monitor - carotid duplex 2023 normal - head CT and brain MRI previously were unrevealing for acute etiology - had incidental choroid fissure noted, mild patchy changes felt related to chronic small vessel ischemic disease, mild for age - prior orthostatics unrevealing - also has h/o intermittent numbness in her arm  and face -> she was previously referred to neurology but no diagnosis made - intolerant of metoprolol so had been managed with Bystolic   Around 07/23/24 she had a very stressful day where she had to put her mule down and also developed Covid. She was treated with course of Paxlovid  with improvement but developed rebound about 7 days after initial infection re-testing positive. (She paused her statin around the course of treatment with Paxlovid .) Towards the tail end of her infection symptoms she noticed an unusual chest discomfort in her left upper chest described as a vibration sensation that would come and go, lasting for hours at a time. It was not associated with any activity, inspiration or palpation. It lasted 3-4 days before resolving without intervention. She had noticed generalized fatigue and dyspnea with exertion in her recovery from Covid. However, she is now back to regular activities including yoga, riding/walking her [place], and walking her dog. She has not had any exertional angina. She feels well today.   Labwork independently reviewed: 11/2023 ALT wnl, LDL 69, trig 51 Scan 12/2022 Cr 0.7, K 4.4 2023 TSH wnl, CBC wnl  ROS: .    Please see the history of present illness.  All other systems are reviewed and otherwise negative.  Studies Reviewed: SABRA    EKG:  EKG is ordered today, personally reviewed, demonstrating:  EKG Interpretation Date/Time:  Tuesday August 19 2024 11:03:11 EDT Ventricular Rate:  58 PR Interval:  174 QRS Duration:  74 QT Interval:  416 QTC Calculation: 408 R Axis:   21  Text Interpretation: Sinus bradycardia When compared with ECG of 21-Nov-2023 09:21, No significant change was found Confirmed  by Kevionna Heffler ([id number]) on 08/19/2024 11:14:13 AM    CV Studies: Cardiac studies reviewed are outlined and summarized above. Otherwise please see EMR for full report.   Current Reported Medications:.    Current Meds  Medication Sig   aspirin  81 MG tablet Take  81 mg by mouth daily.   Coenzyme Q10 (COQ10 PO) Take 1 capsule by mouth daily.    loratadine (CLARITIN) 10 MG tablet Take 10 mg by mouth daily as needed for allergies.    nebivolol  (BYSTOLIC ) 2.5 MG tablet Take 1 tablet daily and may take 1 extra tablet as needed for palpitations daily   nitroGLYCERIN  (NITROSTAT ) 0.4 MG SL tablet PLACE 1 TABLET UNDER THE TONGUE EVERY 5 MINUTES AS NEEDED FOR CHEST PAIN, MAY REPEAT FOR 3 DOSES.   Polyethyl Glycol-Propyl Glycol (SYSTANE OP) Place 2 drops into both eyes as needed (for dry eyes).   rosuvastatin  (CRESTOR ) 20 MG tablet TAKE ONE TABLET BY MOUTH DAILY.    Physical Exam:    VS:  BP 110/70 (BP Location: Left Arm, Patient Position: Sitting, Cuff Size: Normal)   Pulse (!) 58   Ht 5' 7 (1.702 m)   Wt 167 lb (75.8 kg)   BMI 26.16 kg/m    Wt Readings from Last 3 Encounters:  08/19/24 167 lb (75.8 kg)  03/10/24 178 lb (80.7 kg)  11/21/23 174 lb (78.9 kg)    GEN: Well nourished, well developed in no acute distress NECK: No JVD; No carotid bruits CARDIAC: RRR, no murmurs, rubs, gallops RESPIRATORY:  Clear to auscultation without rales, wheezing or rhonchi  ABDOMEN: Soft, non-tender, non-distended EXTREMITIES:  No edema; No acute deformity   Asessement and Plan:.    1. Chest discomfort, DOE, with hx of CAD - chest discomfort description sounds atypical for angina, described more like vibration sensation rather than overt pain, occurred in context of the end of her Covid infection. No pleuritic symptoms. Was not worse during exertion.. Discomfort eventually resolved without intervention. She has noticed some DOE and fatigue that may be related to Covid recovery. She thankfully is back to her usual activities of walking her dog, riding her [place], and doing yoga. We will check CBC/BMET today to ensure stable and follow-up echocardiogram to ensure no change to LVEF with recent Covid infection/stressful animal loss. If LVEF is stable, anticipate surveillance  for recurrent anginal symptoms and gradual return to activity in her recovery. If LVEF is abnormal will discuss next steps with Dr. Shlomo. Continue ASA 81mg  daily, Bystolic  2.5mg  daily, rosuvastatin  20mg  daily.   2. PACs/PVCs, PAT - stable. She reports that recent symptoms were not like prior palpitation episodes. Follow clinically.  3. Baseline sinus bradycardia - stable, follow.  4. Essential HTN - blood pressure controlled, no changes made today.    Disposition: F/u with Dr. Shlomo in January 2026 as scheduled.  Signed, Malikah Principato N Ethne Jeon, PA-C

## 2024-08-19 ENCOUNTER — Ambulatory Visit: Attending: Physician Assistant | Admitting: Physician Assistant

## 2024-08-19 ENCOUNTER — Encounter: Payer: Self-pay | Admitting: Physician Assistant

## 2024-08-19 VITALS — BP 110/70 | HR 58 | Ht 67.0 in | Wt 167.0 lb

## 2024-08-19 DIAGNOSIS — R0609 Other forms of dyspnea: Secondary | ICD-10-CM

## 2024-08-19 DIAGNOSIS — I10 Essential (primary) hypertension: Secondary | ICD-10-CM

## 2024-08-19 DIAGNOSIS — I2510 Atherosclerotic heart disease of native coronary artery without angina pectoris: Secondary | ICD-10-CM

## 2024-08-19 DIAGNOSIS — I491 Atrial premature depolarization: Secondary | ICD-10-CM

## 2024-08-19 DIAGNOSIS — Z1231 Encounter for screening mammogram for malignant neoplasm of breast: Secondary | ICD-10-CM | POA: Diagnosis not present

## 2024-08-19 DIAGNOSIS — R001 Bradycardia, unspecified: Secondary | ICD-10-CM

## 2024-08-19 DIAGNOSIS — I493 Ventricular premature depolarization: Secondary | ICD-10-CM

## 2024-08-19 DIAGNOSIS — R0789 Other chest pain: Secondary | ICD-10-CM | POA: Diagnosis not present

## 2024-08-19 DIAGNOSIS — I4719 Other supraventricular tachycardia: Secondary | ICD-10-CM

## 2024-08-19 NOTE — Patient Instructions (Addendum)
 Medication Instructions:  No changes   *If you need a refill on your cardiac medications before your next appointment, please call your pharmacy*   Lab Work: BMET CBC If you have labs (blood work) drawn today and your tests are completely normal, you will receive your results only by: MyChart Message (if you have MyChart) OR A paper copy in the mail If you have any lab test that is abnormal or we need to change your treatment, we will call you to review the results.   Testing/Procedures: Your physician has requested that you have an echocardiogram. Echocardiography is a painless test that uses sound waves to create images of your heart. It provides your doctor with information about the size and shape of your heart and how well your heart's chambers and valves are working. This procedure takes approximately one hour. There are no restrictions for this procedure. Please do NOT wear cologne, perfume, aftershave, or lotions (deodorant is allowed). Please arrive 15 minutes prior to your appointment time.  Please note: We ask at that you not bring children with you during ultrasound (echo/ vascular) testing. Due to room size and safety concerns, children are not allowed in the ultrasound rooms during exams. Our front office staff cannot provide observation of children in our lobby area while testing is being conducted. An adult accompanying a patient to their appointment will only be allowed in the ultrasound room at the discretion of the ultrasound technician under special circumstances. We apologize for any inconvenience.    Follow-Up: At [facility], you and your health needs are our priority.  As part of our continuing mission to provide you with exceptional heart care, we have created designated Provider Care Teams.  These Care Teams include your primary Cardiologist (physician) and Advanced Practice Providers (APPs -  Physician Assistants and Nurse Practitioners) who all work together to  provide you with the care you need, when you need it.     Your next appointment:    Keep appt 11/17/24  The format for your next appointment:   In Person  Provider:   Wilbert Bihari, MD

## 2024-08-20 ENCOUNTER — Ambulatory Visit: Payer: Self-pay | Admitting: Physician Assistant

## 2024-08-20 LAB — CBC
Hematocrit: 42.4 % (ref 34.0–46.6)
Hemoglobin: 13.9 g/dL (ref 11.1–15.9)
MCH: 30.9 pg (ref 26.6–33.0)
MCHC: 32.8 g/dL (ref 31.5–35.7)
MCV: 94 fL (ref 79–97)
Platelets: 201 x10E3/uL (ref 150–450)
RBC: 4.5 x10E6/uL (ref 3.77–5.28)
RDW: 12.7 % (ref 11.7–15.4)
WBC: 5 x10E3/uL (ref 3.4–10.8)

## 2024-08-20 LAB — BASIC METABOLIC PANEL WITH GFR
BUN/Creatinine Ratio: 21 (ref 12–28)
BUN: 14 mg/dL (ref 8–27)
CO2: 24 mmol/L (ref 20–29)
Calcium: 9.3 mg/dL (ref 8.7–10.3)
Chloride: 103 mmol/L (ref 96–106)
Creatinine, Ser: 0.66 mg/dL (ref 0.57–1.00)
Glucose: 82 mg/dL (ref 70–99)
Potassium: 5 mmol/L (ref 3.5–5.2)
Sodium: 140 mmol/L (ref 134–144)
eGFR: 96 mL/min/1.73 (ref >59)

## 2024-09-30 ENCOUNTER — Ambulatory Visit (HOSPITAL_COMMUNITY)
Admission: RE | Admit: 2024-09-30 | Discharge: 2024-09-30 | Disposition: A | Discharge status: Home / Self Care | Source: Ambulatory Visit | Attending: Physician Assistant | Admitting: Physician Assistant

## 2024-09-30 DIAGNOSIS — R0789 Other chest pain: Secondary | ICD-10-CM | POA: Diagnosis not present

## 2024-09-30 DIAGNOSIS — R079 Chest pain, unspecified: Secondary | ICD-10-CM

## 2024-09-30 DIAGNOSIS — R0609 Other forms of dyspnea: Secondary | ICD-10-CM | POA: Insufficient documentation

## 2024-09-30 DIAGNOSIS — R0600 Dyspnea, unspecified: Secondary | ICD-10-CM

## 2024-09-30 LAB — ECHOCARDIOGRAM COMPLETE
Area-P 1/2: 4.39 cm2
P 1/2 time: 408 ms
S' Lateral: 2.05 cm

## 2024-10-07 DIAGNOSIS — K080 Exfoliation of teeth due to systemic causes: Secondary | ICD-10-CM | POA: Diagnosis not present

## 2024-10-08 DIAGNOSIS — K080 Exfoliation of teeth due to systemic causes: Secondary | ICD-10-CM | POA: Diagnosis not present

## 2024-11-17 ENCOUNTER — Encounter: Payer: Self-pay | Admitting: Cardiology

## 2024-11-17 ENCOUNTER — Ambulatory Visit: Attending: Cardiology | Admitting: Cardiology

## 2024-11-17 VITALS — BP 136/82 | HR 60 | Ht 67.0 in | Wt 179.0 lb

## 2024-11-17 DIAGNOSIS — R911 Solitary pulmonary nodule: Secondary | ICD-10-CM

## 2024-11-17 DIAGNOSIS — E785 Hyperlipidemia, unspecified: Secondary | ICD-10-CM | POA: Diagnosis not present

## 2024-11-17 DIAGNOSIS — I2510 Atherosclerotic heart disease of native coronary artery without angina pectoris: Secondary | ICD-10-CM

## 2024-11-17 DIAGNOSIS — R0609 Other forms of dyspnea: Secondary | ICD-10-CM | POA: Diagnosis not present

## 2024-11-17 DIAGNOSIS — I4719 Other supraventricular tachycardia: Secondary | ICD-10-CM

## 2024-11-17 DIAGNOSIS — Z79899 Other long term (current) drug therapy: Secondary | ICD-10-CM

## 2024-11-17 DIAGNOSIS — I10 Essential (primary) hypertension: Secondary | ICD-10-CM | POA: Diagnosis not present

## 2024-11-17 LAB — LIPID PANEL
Chol/HDL Ratio: 2.1 ratio (ref 0.0–4.4)
Cholesterol, Total: 170 mg/dL (ref 100–199)
HDL: 80 mg/dL
LDL Chol Calc (NIH): 76 mg/dL (ref 0–99)
Triglycerides: 74 mg/dL (ref 0–149)
VLDL Cholesterol Cal: 14 mg/dL (ref 5–40)

## 2024-11-17 LAB — ALT: ALT: 15 IU/L (ref 0–32)

## 2024-11-17 MED ORDER — NEBIVOLOL HCL 2.5 MG PO TABS: ORAL_TABLET | ORAL | 3 refills | Status: AC

## 2024-11-17 MED ORDER — ROSUVASTATIN CALCIUM 20 MG PO TABS: 20.0000 mg | ORAL_TABLET | Freq: Every day | ORAL | 3 refills | Status: DC

## 2024-11-17 NOTE — Patient Instructions (Signed)
 Medication Instructions:  Your physician recommends that you continue on your current medications as directed. Please refer to the Current Medication list given to you today.  *If you need a refill on your cardiac medications before your next appointment, please call your pharmacy*  Lab Work: Please complete a FASTING lipid panel and an ALT in our first floor lab before you leave.  If you have labs (blood work) drawn today and your tests are completely normal, you will receive your results only by: MyChart Message (if you have MyChart) OR A paper copy in the mail If you have any lab test that is abnormal or we need to change your treatment, we will call you to review the results.  Testing/Procedures:   Your cardiac CT will be scheduled at:   [facility] [address]  [place], [place] [address]  If scheduled at the [facility] and [facility] at [address], please enter the parking lot using the [address] entrance and use the FREE valet service at the patient drop-off area. Enter the building and check-in with registration on the main floor.   Please follow these instructions carefully (unless otherwise directed):   On the Day of the Test: Drink plenty of water until 1 hour prior to the test. Do not eat any food 1 hour prior to test. You may take your regular medications prior to the test.  If you take Furosemide/Hydrochlorothiazide/Spironolactone/Chlorthalidone, please HOLD on the morning of the test. Patients who wear a continuous glucose monitor MUST remove the device prior to scanning. FEMALES- please wear underwire-free bra if available, avoid dresses & tight clothing        We will call to schedule your test 2-4 weeks out understanding that some insurance companies will need an authorization prior to the service being performed.   For more information and frequently asked questions, please visit our website :  [email or web address]  For non-scheduling related questions, please contact the cardiac imaging nurse navigator should you have any questions/concerns: Cardiac Imaging Nurse Navigators Direct Office Dial: [phone]   For scheduling needs, including cancellations and rescheduling, please call Brittany, [phone].   Follow-Up: At [facility], you and your health needs are our priority.  As part of our continuing mission to provide you with exceptional heart care, our providers are all part of one team.  This team includes your primary Cardiologist (physician) and Advanced Practice Providers or APPs (Physician Assistants and Nurse Practitioners) who all work together to provide you with the care you need, when you need it.  Your next appointment:   1 year(s)  Provider:   Wilbert Bihari, MD

## 2024-11-17 NOTE — Addendum Note (Signed)
 Addended by: JANIT GENI CROME on: 11/17/2024 09:27 AM   Modules accepted: Orders

## 2024-11-17 NOTE — Progress Notes (Signed)
 " Cardiology Office Note:    Date:  11/17/2024   ID:  Maria Cochran, DOB December 20, 1955, MRN [id number]  PCP:  Vernon Velna SAUNDERS, MD  Cardiologist:  Wilbert Bihari, MD    Referring MD: Vernon Velna SAUNDERS, MD   Chief Complaint  Patient presents with   Follow-up    PAT, CAD, HTN, HLD    History of Present Illness:    Maria Cochran is a 69 y.o. female with a hx of PVC's and nonsustained atrial tachycardia, dyslipidemia and borderline obstructive ASCAD of the RPDA of 75% with no ischemia on nuclear stress test and nonobstructive elsewhere by cath on medical management.  Event monitor 3/23 showed no arrhythmias. She had a nuclear stress test in 7/23 for SOB that showed no ischemia and normal LVF.  Hbg and TSH were normal in July 23.   She is here today and is doing well.  She denies any CP, PNA, orthopnea, LE edema or syncope. She still has DOE that she thinks is related to obesity and sedentary state.  The DOE is very random and very brief but is with exertion. She says that it is not very bothersome. She randomly will have some palpitations over a periods of a few days and then resolve.  She had this last week.  She described it as irregular.    Past Medical History:  Diagnosis Date   Atrial tachycardia, paroxysmal    Colonic polyp    Coronary artery disease 2016   50% prox to mid LAD, 50% ramus, 40% ostial D1, 45% distal LCX and 30% prox to mid RCA and 75% RPDA with no ischemia in inferior wall on nuclear stress test - on medical management.  Stress PET CT 12/2023 with no ischemia and normal myocardial blood flow reserve   DDD (degenerative disc disease)    Hypercholesteremia    Hypertension    PAC (premature atrial contraction)    Pulmonary nodule    a. being followed by PCP, stable 07/2016.   PVC's (premature ventricular contractions)    Sinus bradycardia    Vertigo     Past Surgical History:  Procedure Laterality Date   CARDIAC CATHETERIZATION N/A 08/13/2015   Procedure: Left Heart Cath and  Coronary Angiography;  Surgeon: Victory LELON Sharps, MD;  Location: [facility] INVASIVE CV LAB;  Service: Cardiovascular;  Laterality: N/A;   TONSILLECTOMY      Current Medications: Current Meds  Medication Sig   aspirin  81 MG tablet Take 81 mg by mouth daily.   Coenzyme Q10 (COQ10 PO) Take 1 capsule by mouth daily.    loratadine (CLARITIN) 10 MG tablet Take 10 mg by mouth daily as needed for allergies.    nebivolol  (BYSTOLIC ) 2.5 MG tablet Take 1 tablet daily and may take 1 extra tablet as needed for palpitations daily   nitroGLYCERIN  (NITROSTAT ) 0.4 MG SL tablet PLACE 1 TABLET UNDER THE TONGUE EVERY 5 MINUTES AS NEEDED FOR CHEST PAIN, MAY REPEAT FOR 3 DOSES.   Polyethyl Glycol-Propyl Glycol (SYSTANE OP) Place 2 drops into both eyes as needed (for dry eyes).   rosuvastatin  (CRESTOR ) 20 MG tablet TAKE ONE TABLET BY MOUTH DAILY.     Allergies:   Sudafed [pseudoephedrine hcl], Metoprolol, Buprenorphine hcl, Morphine and codeine, Allegra [fexofenadine hcl], Doxycycline monohydrate, Lipitor [atorvastatin ], Penicillins, Sulfa antibiotics, Sulfacetamide sodium, and Tetracyclines & related   Social History   Socioeconomic History   Marital status: Single    Spouse name: Not on file   Number of children: 0  Years of education: 39   Highest education level: Not on file  Occupational History   Occupation: environmental manager  Tobacco Use   Smoking status: Never   Smokeless tobacco: Never  Vaping Use   Vaping status: Never Used  Substance and Sexual Activity   Alcohol use: Yes    Alcohol/week: 2.0 standard drinks of alcohol    Types: 2 Glasses of wine per week    Comment: occasional beer and wine   Drug use: No   Sexual activity: Not on file  Other Topics Concern   Not on file  Social History Narrative   Lives with partner in a one story home.  Has no children.  Works as a environmental manager.  Education: college.    Social Drivers of Health   Tobacco Use: Low Risk (11/17/2024)   Patient History    Smoking  Tobacco Use: Never    Smokeless Tobacco Use: Never    Passive Exposure: Not on file  Financial Resource Strain: Not on file  Food Insecurity: Not on file  Transportation Needs: Not on file  Physical Activity: Not on file  Stress: Not on file  Social Connections: Not on file  Depression (EYV7-0): Not on file  Alcohol Screen: Not on file  Housing: Not on file  Utilities: Not on file  Health Literacy: Not on file     Family History: [name] family history includes CAD in her father; Heart disease in her father; Hypertension in her father; Parkinsonism in her mother.  ROS:   Please see the history of present illness.    ROS  All other systems reviewed and negative.   EKGs/Labs/Other Studies Reviewed:    The following studies were reviewed today:   Recent Labs: 11/22/2023: ALT 19 08/19/2024: BUN 14; Creatinine, Ser 0.66; Hemoglobin 13.9; Platelets 201; Potassium 5.0; Sodium 140   Recent Lipid Panel    Component Value Date/Time   CHOL 160 11/22/2023 0905   TRIG 51 11/22/2023 0905   HDL 80 11/22/2023 0905   CHOLHDL 2.0 11/22/2023 0905   CHOLHDL 1.8 05/12/2016 0818   VLDL 11 05/12/2016 0818   LDLCALC 69 11/22/2023 0905    Physical Exam:    VS:  BP 136/82   Pulse 60   Ht 5' 7 (1.702 m)   Wt 179 lb (81.2 kg)   SpO2 96%   BMI 28.04 kg/m     Wt Readings from Last 3 Encounters:  11/17/24 179 lb (81.2 kg)  08/19/24 167 lb (75.8 kg)  03/10/24 178 lb (80.7 kg)    GEN: Well nourished, well developed in no acute distress HEENT: Normal NECK: No JVD; No carotid bruits LYMPHATICS: No lymphadenopathy CARDIAC:RRR, no murmurs, rubs, gallops RESPIRATORY:  Clear to auscultation without rales, wheezing or rhonchi  ABDOMEN: Soft, non-tender, non-distended MUSCULOSKELETAL:  No edema; No deformity  SKIN: Warm and dry NEUROLOGIC:  Alert and oriented x 3 PSYCHIATRIC:  Normal affect  ASSESSMENT:    1. PAT (paroxysmal atrial tachycardia)   2. CAD in native artery   3.  DOE (dyspnea on exertion)   4. Essential hypertension   5. Hyperlipidemia LDL goal <70   6. Pulmonary nodule     PLAN:    In order of problems listed above:  Nonsustained atrial tachycardia -remains in NSR on exam today -she randomly has palpitations with skipped heart beats over a few days and then it resolves.  -continue Bystolic  2.5mg  daily with an extra as needed for breakthrough palpitations with PRN refills  ASCAD/SOB/chest pain -cath 2016 showed 50% prox to mid LAD, 50% ramus, 40% ostial D1, 45% distal LCX and 30% prox to mid RCA and 75% RPDA with no ischemia in inferior wall on nuclear stress test - on medical management.   -nuclear stress test done for shortness of breath 7/23 with no ischemia -2D echo 12/26/2022 showed EF 60 to 65% with grade 2 diastolic dysfunction and RAP 3 mmHg -Stress PET CT 12/2023 showed no ischemia -Denies any anginal sx.  Continues to have chronic SOB.We discussed cardiopulmonary stress testing but she thinks SOB is related to overweight and sedentary state and has started to exercise. -continue ASA 81mg  daily, Bystolic  2.5mg  daily and Crestor  20mg  daily with PRN refills   HTN  -BP controlled on exam today -continue Bystolic  2.5mg  daily with PRN refills   Hyperlipidemia  - her LDL goal is < 70.   - I have personally reviewed and interpreted outside labs performed by patient's PCP which showed LDL 69 and HDL 80 on 11/22/2023 -repeat FLP and ALT -continue Crestor  20mg  daily with PRN refills  Pulmonary nodule -first noted in 2016 and records sent to her PCP to followup on.   -CT at time of stress PET study did not mention pulmonary nodule but did not cover the entire lung fields -Check a full chest CT WO contrast to folowup on lung nodule   Medication Adjustments/Labs and Tests Ordered: Current medicines are reviewed at length with the patient today.  Concerns regarding medicines are outlined above.  No orders of the defined types were placed in  this encounter.  No orders of the defined types were placed in this encounter.   Signed, Wilbert Bihari, MD  11/17/2024 9:12 AM    [facility] "

## 2024-11-18 ENCOUNTER — Ambulatory Visit: Payer: Self-pay | Admitting: Cardiology

## 2024-12-01 ENCOUNTER — Ambulatory Visit (HOSPITAL_COMMUNITY)

## 2024-12-05 ENCOUNTER — Ambulatory Visit (HOSPITAL_COMMUNITY)
Admission: RE | Admit: 2024-12-05 | Discharge: 2024-12-05 | Disposition: A | Source: Ambulatory Visit | Attending: Cardiovascular Disease | Admitting: Cardiovascular Disease

## 2024-12-05 DIAGNOSIS — R911 Solitary pulmonary nodule: Secondary | ICD-10-CM | POA: Diagnosis present

## 2024-12-10 ENCOUNTER — Other Ambulatory Visit: Payer: Self-pay

## 2024-12-10 DIAGNOSIS — E7800 Pure hypercholesterolemia, unspecified: Secondary | ICD-10-CM

## 2024-12-10 DIAGNOSIS — I2510 Atherosclerotic heart disease of native coronary artery without angina pectoris: Secondary | ICD-10-CM

## 2024-12-10 DIAGNOSIS — Z79899 Other long term (current) drug therapy: Secondary | ICD-10-CM

## 2024-12-10 MED ORDER — ROSUVASTATIN CALCIUM 40 MG PO TABS: 40.0000 mg | ORAL_TABLET | Freq: Every day | ORAL | 3 refills | Status: AC
